# Patient Record
Sex: Female | Born: 1970 | Race: Black or African American | Hispanic: No | Smoking: Never smoker
Health system: Southern US, Community
[De-identification: ages and names within clinical notes are randomized; demographics above are authoritative.]

## PROBLEM LIST (undated history)

## (undated) DIAGNOSIS — I10 Essential (primary) hypertension: Secondary | ICD-10-CM

---

## 2014-04-10 DIAGNOSIS — E119 Type 2 diabetes mellitus without complications: Secondary | ICD-10-CM | POA: Insufficient documentation

## 2014-04-10 DIAGNOSIS — E1165 Type 2 diabetes mellitus with hyperglycemia: Secondary | ICD-10-CM | POA: Insufficient documentation

## 2015-07-25 DIAGNOSIS — N920 Excessive and frequent menstruation with regular cycle: Secondary | ICD-10-CM | POA: Insufficient documentation

## 2016-08-20 DIAGNOSIS — E113399 Type 2 diabetes mellitus with moderate nonproliferative diabetic retinopathy without macular edema, unspecified eye: Secondary | ICD-10-CM | POA: Insufficient documentation

## 2019-08-30 ENCOUNTER — Other Ambulatory Visit: Payer: Self-pay

## 2019-08-30 DIAGNOSIS — Z20822 Contact with and (suspected) exposure to covid-19: Secondary | ICD-10-CM

## 2019-09-01 LAB — NOVEL CORONAVIRUS, NAA: SARS-CoV-2, NAA: NOT DETECTED

## 2020-02-21 ENCOUNTER — Emergency Department: Payer: BLUE CROSS/BLUE SHIELD

## 2020-02-21 ENCOUNTER — Other Ambulatory Visit: Payer: Self-pay

## 2020-02-21 ENCOUNTER — Emergency Department
Admission: EM | Admit: 2020-02-21 | Discharge: 2020-02-21 | Disposition: A | Discharge status: Home / Self Care | Payer: BLUE CROSS/BLUE SHIELD | Attending: Emergency Medicine | Admitting: Emergency Medicine

## 2020-02-21 DIAGNOSIS — X58XXXA Exposure to other specified factors, initial encounter: Secondary | ICD-10-CM | POA: Insufficient documentation

## 2020-02-21 DIAGNOSIS — Y929 Unspecified place or not applicable: Secondary | ICD-10-CM | POA: Insufficient documentation

## 2020-02-21 DIAGNOSIS — L03039 Cellulitis of unspecified toe: Secondary | ICD-10-CM | POA: Diagnosis not present

## 2020-02-21 DIAGNOSIS — Y999 Unspecified external cause status: Secondary | ICD-10-CM | POA: Diagnosis not present

## 2020-02-21 DIAGNOSIS — Y93E8 Activity, other personal hygiene: Secondary | ICD-10-CM | POA: Insufficient documentation

## 2020-02-21 DIAGNOSIS — M79674 Pain in right toe(s): Secondary | ICD-10-CM | POA: Diagnosis present

## 2020-02-21 MED ORDER — SULFAMETHOXAZOLE-TRIMETHOPRIM 800-160 MG PO TABS
1.0000 | ORAL_TABLET | Freq: Once | ORAL | Status: AC
  Administered 2020-02-21: 1 via ORAL
  Filled 2020-02-21: qty 1

## 2020-02-21 MED ORDER — SULFAMETHOXAZOLE-TRIMETHOPRIM 400-80 MG PO TABS: 2.0000 | ORAL_TABLET | Freq: Two times a day (BID) | ORAL | 0 refills | Status: AC

## 2020-02-21 NOTE — ED Notes (Signed)
Reviewed discharge instructions, follow-up care, and prescriptions with patient. Patient verbalized understanding of all information reviewed. Patient stable, with no distress noted at this time.    

## 2020-02-21 NOTE — ED Provider Notes (Signed)
[facility] Emergency Department Provider Note  ____________________________________________  Time seen: Approximately 10:35 PM  I have reviewed the triage vital signs and the nursing notes.   HISTORY  Chief Complaint Toe Pain    HPI Shannon Cox is a 49 y.o. female that presents to emergency department for evaluation of infection to right great toenail.  Patient states that she had a pedicure a couple of days ago.  When she got the pedicure, she jumped and did get a minor injury to the outside of her toenail.  She clipped the side of her toenail today and noticed some drainage coming out from underneath the toenail.  She was also walking around in [place] on her feet all day for 4 days.  Patient is a diabetic and has neuropathy.  No fevers.   No past medical history on file.  There are no problems to display for this patient.   Prior to Admission medications   Medication Sig Start Date End Date Taking? Authorizing Provider  sulfamethoxazole-trimethoprim (BACTRIM) 400-80 MG tablet Take 2 tablets by mouth 2 (two) times daily for 10 days. 02/21/20 03/02/20  Laban Emperor, PA-C    Allergies Insulins  No family history on file.  Social History Social History   Tobacco Use  . Smoking status: Not on file  Substance Use Topics  . Alcohol use: Not on file  . Drug use: Not on file     Review of Systems  Constitutional: No fever/chills Gastrointestinal: No nausea, no vomiting.  Musculoskeletal: Negative for musculoskeletal pain. Skin: Negative for rash, abrasions, lacerations, ecchymosis. Neurological: Negative for new numbness or tingling   ____________________________________________   PHYSICAL EXAM:  VITAL SIGNS: ED Triage Vitals  Enc Vitals Group     BP 02/21/20 2147 (!) 158/68     Pulse Rate 02/21/20 2147 95     Resp 02/21/20 2147 20     Temp 02/21/20 2147 98.7 F (37.1 C)     Temp Source 02/21/20 2147 Oral     SpO2 02/21/20  2147 100 %     Weight 02/21/20 2148 258 lb (117 kg)     Height 02/21/20 2148 5\' 4"  (1.626 m)     Head Circumference --      Peak Flow --      Pain Score 02/21/20 2148 0     Pain Loc --      Pain Edu? --      Excl. in [place]? --      Constitutional: Alert and oriented. Well appearing and in no acute distress. Eyes: Conjunctivae are normal. PERRL. EOMI. Head: Atraumatic. ENT:      Ears:      Nose: No congestion/rhinnorhea.      Mouth/Throat: Mucous membranes are moist.  Neck: No stridor.  Cardiovascular: Normal rate, regular rhythm.  Good peripheral circulation. Respiratory: Normal respiratory effort without tachypnea or retractions. Lungs CTAB. Good air entry to the bases with no decreased or absent breath sounds. Musculoskeletal: Full range of motion to all extremities. No gross deformities appreciated.  No swelling or erythema to right great toe. No tenderness to plantar toe.  Light yellow drainage from lateral right great toenail. Neurologic:  Normal speech and language. No gross focal neurologic deficits are appreciated.  Skin:  Skin is warm, dry and intact. No rash noted. Psychiatric: Mood and affect are normal. Speech and behavior are normal. Patient exhibits appropriate insight and judgement.   ____________________________________________   LABS (all labs ordered are listed, but only  abnormal results are displayed)  Labs Reviewed - No data to display ____________________________________________  EKG   ____________________________________________  RADIOLOGY Lexine Baton, personally viewed and evaluated these images (plain radiographs) as part of my medical decision making, as well as reviewing the written report by the radiologist.  DG Foot Complete Right  Result Date: 02/21/2020 CLINICAL DATA:  Right great toe soreness, drainage EXAM: RIGHT FOOT COMPLETE - 3+ VIEW COMPARISON:  None. FINDINGS: Frontal, oblique, and lateral views of the right foot are obtained. No  fracture, subluxation, or dislocation. Joint spaces are well preserved. Soft tissues are unremarkable. IMPRESSION: 1. No acute or destructive bony lesion. Electronically Signed   By: Sharlet Salina M.D.   On: 02/21/2020 22:53    ____________________________________________    PROCEDURES  Procedure(s) performed:    Procedures    Medications  sulfamethoxazole-trimethoprim (BACTRIM DS) 800-160 MG per tablet 1 tablet (1 tablet Oral Given 02/21/20 2247)     ____________________________________________   INITIAL IMPRESSION / ASSESSMENT AND PLAN / ED COURSE  Pertinent labs & imaging results that were available during my care of the patient were reviewed by me and considered in my medical decision making (see chart for details).  Review of the [place] CSRS was performed in accordance of the [facility] prior to dispensing any controlled drugs.    Patient's diagnosis is consistent with toenail infection. No acute abnormality on xray. Patient will be discharged home with prescriptions for Bactrim. Patient is to follow up with PCP as directed. Patient is given ED precautions to return to the ED for any worsening or new symptoms.   Shannon Cox was evaluated in Emergency Department on 02/21/2020 for the symptoms described in the history of present illness. She was evaluated in the context of the global COVID-19 pandemic, which necessitated consideration that the patient might be at risk for infection with the SARS-CoV-2 virus that causes COVID-19. Institutional protocols and algorithms that pertain to the evaluation of patients at risk for COVID-19 are in a state of rapid change based on information released by regulatory bodies including the CDC and federal and state organizations. These policies and algorithms were followed during the patient's care in the ED.  ____________________________________________  FINAL CLINICAL IMPRESSION(S) / ED DIAGNOSES  Final diagnoses:  Infection of toenail       NEW MEDICATIONS STARTED DURING THIS VISIT:  ED Discharge Orders         Ordered    sulfamethoxazole-trimethoprim (BACTRIM) 400-80 MG tablet  2 times daily     02/21/20 2323              This chart was dictated using voice recognition software/Dragon. Despite best efforts to proofread, errors can occur which can change the meaning. Any change was purely unintentional.    Enid Derry, PA-C 02/21/20 2354    Dionne Bucy, MD 02/24/20 [phone]

## 2020-02-21 NOTE — ED Triage Notes (Signed)
Pt states she had pedicure done 2 weeks ago, went to [place] and did a lot of walking. She noted some right great toe soreness and today she clipped a piece of toe nail that was rubbing irritating 2nd toe and noted some drainage from nail bed.

## 2020-02-21 NOTE — ED Notes (Addendum)
See triage note- pt here with right big toe pain and nail drainage. States she had gel polish on toenials, peeled It off and clipped the toe nail. Has noticed puss is draining out of toe when she squeezes it. Pt is a diabetic, has neuropathy.

## 2020-02-21 NOTE — ED Notes (Signed)
Xray tech at bedside.

## 2020-02-24 ENCOUNTER — Telehealth: Payer: Self-pay | Admitting: Emergency Medicine

## 2020-02-24 MED ORDER — CLINDAMYCIN HCL 300 MG PO CAPS: 300.0000 mg | ORAL_CAPSULE | Freq: Three times a day (TID) | ORAL | 0 refills | Status: AC

## 2020-02-24 NOTE — Telephone Encounter (Cosign Needed)
Patient allergic to the Bactrim that I prescribed for her toenail infection. Will send in Clindamycin.

## 2020-03-21 ENCOUNTER — Ambulatory Visit: Payer: BLUE CROSS/BLUE SHIELD | Admitting: Cardiology

## 2020-07-27 ENCOUNTER — Emergency Department
Admission: EM | Admit: 2020-07-27 | Discharge: 2020-07-27 | Disposition: A | Discharge status: Home / Self Care | Payer: BLUE CROSS/BLUE SHIELD | Attending: Emergency Medicine | Admitting: Emergency Medicine

## 2020-07-27 ENCOUNTER — Other Ambulatory Visit: Payer: Self-pay

## 2020-07-27 ENCOUNTER — Emergency Department: Payer: BLUE CROSS/BLUE SHIELD

## 2020-07-27 DIAGNOSIS — R609 Edema, unspecified: Secondary | ICD-10-CM | POA: Insufficient documentation

## 2020-07-27 DIAGNOSIS — Z7984 Long term (current) use of oral hypoglycemic drugs: Secondary | ICD-10-CM | POA: Diagnosis not present

## 2020-07-27 DIAGNOSIS — R2243 Localized swelling, mass and lump, lower limb, bilateral: Secondary | ICD-10-CM | POA: Diagnosis present

## 2020-07-27 DIAGNOSIS — E113299 Type 2 diabetes mellitus with mild nonproliferative diabetic retinopathy without macular edema, unspecified eye: Secondary | ICD-10-CM | POA: Insufficient documentation

## 2020-07-27 DIAGNOSIS — R0602 Shortness of breath: Secondary | ICD-10-CM | POA: Diagnosis not present

## 2020-07-27 LAB — CBC WITH DIFFERENTIAL/PLATELET
Abs Immature Granulocytes: 0.02 10*3/uL (ref 0.00–0.07)
Basophils Absolute: 0.1 10*3/uL (ref 0.0–0.1)
Basophils Relative: 1 %
Eosinophils Absolute: 0.3 10*3/uL (ref 0.0–0.5)
Eosinophils Relative: 4 %
HCT: 40.5 % (ref 36.0–46.0)
Hemoglobin: 12.4 g/dL (ref 12.0–15.0)
Immature Granulocytes: 0 %
Lymphocytes Relative: 34 %
Lymphs Abs: 2.6 10*3/uL (ref 0.7–4.0)
MCH: 22.3 pg — ABNORMAL LOW (ref 26.0–34.0)
MCHC: 30.6 g/dL (ref 30.0–36.0)
MCV: 72.7 fL — ABNORMAL LOW (ref 80.0–100.0)
Monocytes Absolute: 0.6 10*3/uL (ref 0.1–1.0)
Monocytes Relative: 7 %
Neutro Abs: 4 10*3/uL (ref 1.7–7.7)
Neutrophils Relative %: 54 %
Platelets: 289 10*3/uL (ref 150–400)
RBC: 5.57 MIL/uL — ABNORMAL HIGH (ref 3.87–5.11)
RDW: 22 % — ABNORMAL HIGH (ref 11.5–15.5)
WBC: 7.5 10*3/uL (ref 4.0–10.5)
nRBC: 0 % (ref 0.0–0.2)

## 2020-07-27 LAB — BASIC METABOLIC PANEL
Anion gap: 9 (ref 5–15)
BUN: 15 mg/dL (ref 6–20)
CO2: 28 mmol/L (ref 22–32)
Calcium: 8.6 mg/dL — ABNORMAL LOW (ref 8.9–10.3)
Chloride: 101 mmol/L (ref 98–111)
Creatinine, Ser: 0.75 mg/dL (ref 0.44–1.00)
GFR, Estimated: >60 mL/min (ref >60)
Glucose, Bld: 257 mg/dL — ABNORMAL HIGH (ref 70–99)
Potassium: 3.7 mmol/L (ref 3.5–5.1)
Sodium: 138 mmol/L (ref 135–145)

## 2020-07-27 LAB — BRAIN NATRIURETIC PEPTIDE: B Natriuretic Peptide: 28.7 pg/mL (ref 0.0–100.0)

## 2020-07-27 LAB — TROPONIN I (HIGH SENSITIVITY): Troponin I (High Sensitivity): 8 ng/L (ref <18)

## 2020-07-27 NOTE — Discharge Instructions (Addendum)
Your lab tests, chest xray, and ekg today were all okay. Please follow up with your doctor for further evaluation of your leg swelling symptoms.

## 2020-07-27 NOTE — ED Notes (Signed)
Patient transported to X-ray 

## 2020-07-27 NOTE — ED Provider Notes (Signed)
[facility] Emergency Department Provider Note  ____________________________________________  Time seen: Approximately 11:20 PM  I have reviewed the triage vital signs and the nursing notes.   HISTORY  Chief Complaint Leg Swelling    HPI Shannon Cox is a 49 y.o. female with a history of diabetes and obesity who comes the ED complaining of peripheral edema ongoing for the past year, worse in the past week.   Associated with dyspnea on exertion.  Denies orthopnea or PND.  No chest pain.  Symptoms are better waxing and waning.        Patient Active Problem List   Diagnosis Date Noted  . Moderate nonproliferative diabetic retinopathy (HCC) 08/20/2016  . Menorrhagia 07/25/2015  . Poorly controlled type 2 diabetes mellitus (HCC) 04/10/2014        Prior to Admission medications   Not on File  Jardiance   Allergies Insulins   No family history on file.  Social History Social History   Tobacco Use  . Smoking status: Not on file  Substance Use Topics  . Alcohol use: Not on file  . Drug use: Not on file  No tobacco or alcohol use  Review of Systems  Constitutional:   No fever or chills.  ENT:   No sore throat. No rhinorrhea. Cardiovascular:   No chest pain or syncope. Respiratory:   Positive dyspnea on exertion without cough. Gastrointestinal:   Negative for abdominal pain, vomiting and diarrhea.  Musculoskeletal:   Positive bilateral leg swelling All other systems reviewed and are negative except as documented above in ROS and HPI.  ____________________________________________   PHYSICAL EXAM:  VITAL SIGNS: ED Triage Vitals  Enc Vitals Group     BP 07/27/20 2118 (!) 151/74     Pulse Rate 07/27/20 2118 95     Resp --      Temp 07/27/20 2118 98.8 F (37.1 C)     Temp src --      SpO2 07/27/20 2118 99 %     Weight 07/27/20 2114 260 lb (117.9 kg)     Height 07/27/20 2114 5\' 5"  (1.651 m)     Head Circumference --       Peak Flow --      Pain Score 07/27/20 2114 6     Pain Loc --      Pain Edu? --      Excl. in GC? --     Vital signs reviewed, nursing assessments reviewed.   Constitutional:   Alert and oriented. Non-toxic appearance. Eyes:   Conjunctivae are normal. EOMI. PERRL. ENT      Head:   Normocephalic and atraumatic.      Nose:   Wearing a mask.      Mouth/Throat:   Wearing a mask.      Neck:   No meningismus. Full ROM.  No JVD Hematological/Lymphatic/Immunilogical:   No cervical lymphadenopathy. Cardiovascular:   RRR. Symmetric bilateral radial and DP pulses.  No murmurs. Cap refill less than 2 seconds. Respiratory:   Normal respiratory effort without tachypnea/retractions. Breath sounds are clear and equal bilaterally. No wheezes/rales/rhonchi. Gastrointestinal:   Soft and nontender. Non distended. There is no CVA tenderness.  No rebound, rigidity, or guarding.  Musculoskeletal:   Normal range of motion in all extremities. No joint effusions.  No lower extremity tenderness.  Trace pitting edema bilateral lower extremities.  Symmetric calf circumference. Neurologic:   Normal speech and language.  Motor grossly intact. No acute focal neurologic deficits are  appreciated.  Skin:    Skin is warm, dry and intact. No rash noted.  No petechiae, purpura, or bullae.  ____________________________________________    LABS (pertinent positives/negatives) (all labs ordered are listed, but only abnormal results are displayed) Labs Reviewed  BASIC METABOLIC PANEL - Abnormal; Notable for the following components:      Result Value   Glucose, Bld 257 (*)    Calcium 8.6 (*)    All other components within normal limits  CBC WITH DIFFERENTIAL/PLATELET - Abnormal; Notable for the following components:   RBC 5.57 (*)    MCV 72.7 (*)    MCH 22.3 (*)    RDW 22.0 (*)    All other components within normal limits  BRAIN NATRIURETIC PEPTIDE  TROPONIN I (HIGH SENSITIVITY)    ____________________________________________   EKG  Interpreted by me Normal sinus rhythm rate of 97, normal axis and intervals.  Poor R wave progression.  Normal ST segments and T waves.  No ischemic changes  ____________________________________________    RADIOLOGY  DG Chest 2 View  Result Date: 07/27/2020 CLINICAL DATA:  Shortness of breath and leg swelling for about a week. EXAM: CHEST - 2 VIEW COMPARISON:  None. FINDINGS: Borderline heart size with normal pulmonary vascularity. No airspace disease, consolidation, or effusion. No pneumothorax. Mediastinal contours appear intact. IMPRESSION: No active cardiopulmonary disease. Electronically Signed   By: Burman Nieves M.D.   On: 07/27/2020 22:52    ____________________________________________   PROCEDURES Procedures  ____________________________________________  DIFFERENTIAL DIAGNOSIS   Non-STEMI, new onset CHF, dependent edema, renal failure, electrolyte abnormality  CLINICAL IMPRESSION / ASSESSMENT AND PLAN / ED COURSE  Medications ordered in the ED: Medications - No data to display  Pertinent labs & imaging results that were available during my care of the patient were reviewed by me and considered in my medical decision making (see chart for details).  Shannon Cox was evaluated in Emergency Department on 07/27/2020 for the symptoms described in the history of present illness. She was evaluated in the context of the global COVID-19 pandemic, which necessitated consideration that the patient might be at risk for infection with the SARS-CoV-2 virus that causes COVID-19. Institutional protocols and algorithms that pertain to the evaluation of patients at risk for COVID-19 are in a state of rapid change based on information released by regulatory bodies including the CDC and federal and state organizations. These policies and algorithms were followed during the patient's care in the ED.   Patient presents  with peripheral edema that has been chronic, but worse in the past few weeks and now associated with dyspnea on exertion.  She has no orthopnea, vitals are normal, exam is reassuring.  Labs including troponin and BNP are normal. Considering the patient's symptoms, medical history, and physical examination today, I have low suspicion for ACS, PE, TAD, pneumothorax, carditis, mediastinitis, pneumonia, CHF, or sepsis.  Doubt DVT.  We will plan to discharge to follow-up with her primary care doctor for further evaluation, consideration of TTE.      ____________________________________________   FINAL CLINICAL IMPRESSION(S) / ED DIAGNOSES    Final diagnoses:  Peripheral edema     ED Discharge Orders    None      Portions of this note were generated with dragon dictation software. Dictation errors may occur despite best attempts at proofreading.   Sharman Cheek, MD 07/27/20 2325

## 2020-07-27 NOTE — ED Triage Notes (Signed)
FIRST NURSE NOTE:  Pt arrived via POV with c/o fluid build up from hips down, states it has been going on for about a week and also reports causing her to be short of breath. Pt ambulatory without difficulty at this time.

## 2021-04-01 ENCOUNTER — Other Ambulatory Visit: Payer: Self-pay | Admitting: Internal Medicine

## 2021-04-01 ENCOUNTER — Other Ambulatory Visit: Payer: Self-pay

## 2021-04-01 ENCOUNTER — Ambulatory Visit
Admission: RE | Admit: 2021-04-01 | Discharge: 2021-04-01 | Disposition: A | Discharge status: Home / Self Care | Payer: Self-pay | Attending: Internal Medicine | Admitting: Internal Medicine

## 2021-04-01 ENCOUNTER — Ambulatory Visit
Admission: RE | Admit: 2021-04-01 | Discharge: 2021-04-01 | Disposition: A | Discharge status: Home / Self Care | Payer: Self-pay | Source: Ambulatory Visit | Attending: Internal Medicine | Admitting: Internal Medicine

## 2021-04-01 DIAGNOSIS — M549 Dorsalgia, unspecified: Secondary | ICD-10-CM

## 2021-12-28 ENCOUNTER — Emergency Department: Payer: 59

## 2021-12-28 ENCOUNTER — Other Ambulatory Visit: Payer: Self-pay

## 2021-12-28 ENCOUNTER — Emergency Department
Admission: EM | Admit: 2021-12-28 | Discharge: 2021-12-28 | Disposition: A | Discharge status: Home / Self Care | Payer: 59 | Attending: Emergency Medicine | Admitting: Emergency Medicine

## 2021-12-28 DIAGNOSIS — R051 Acute cough: Secondary | ICD-10-CM | POA: Diagnosis not present

## 2021-12-28 DIAGNOSIS — J329 Chronic sinusitis, unspecified: Secondary | ICD-10-CM | POA: Diagnosis not present

## 2021-12-28 DIAGNOSIS — R079 Chest pain, unspecified: Secondary | ICD-10-CM | POA: Diagnosis not present

## 2021-12-28 DIAGNOSIS — Z20822 Contact with and (suspected) exposure to covid-19: Secondary | ICD-10-CM | POA: Diagnosis not present

## 2021-12-28 DIAGNOSIS — R0602 Shortness of breath: Secondary | ICD-10-CM | POA: Insufficient documentation

## 2021-12-28 DIAGNOSIS — J069 Acute upper respiratory infection, unspecified: Secondary | ICD-10-CM | POA: Diagnosis not present

## 2021-12-28 LAB — BASIC METABOLIC PANEL
Anion gap: 8 (ref 5–15)
BUN: 16 mg/dL (ref 6–20)
CO2: 29 mmol/L (ref 22–32)
Calcium: 9.1 mg/dL (ref 8.9–10.3)
Chloride: 99 mmol/L (ref 98–111)
Creatinine, Ser: 0.92 mg/dL (ref 0.44–1.00)
GFR, Estimated: >60 mL/min (ref >60)
Glucose, Bld: 466 mg/dL — ABNORMAL HIGH (ref 70–99)
Potassium: 3.7 mmol/L (ref 3.5–5.1)
Sodium: 136 mmol/L (ref 135–145)

## 2021-12-28 LAB — CBC
HCT: 38.3 % (ref 36.0–46.0)
Hemoglobin: 11.8 g/dL — ABNORMAL LOW (ref 12.0–15.0)
MCH: 23.6 pg — ABNORMAL LOW (ref 26.0–34.0)
MCHC: 30.8 g/dL (ref 30.0–36.0)
MCV: 76.8 fL — ABNORMAL LOW (ref 80.0–100.0)
Platelets: 275 10*3/uL (ref 150–400)
RBC: 4.99 MIL/uL (ref 3.87–5.11)
RDW: 14.3 % (ref 11.5–15.5)
WBC: 6.9 10*3/uL (ref 4.0–10.5)
nRBC: 0 % (ref 0.0–0.2)

## 2021-12-28 LAB — RESP PANEL BY RT-PCR (FLU A&B, COVID) ARPGX2
Influenza A by PCR: NEGATIVE
Influenza B by PCR: NEGATIVE
SARS Coronavirus 2 by RT PCR: NEGATIVE

## 2021-12-28 LAB — TROPONIN I (HIGH SENSITIVITY): Troponin I (High Sensitivity): 10 ng/L (ref <18)

## 2021-12-28 MED ORDER — AZITHROMYCIN 250 MG PO TABS: ORAL_TABLET | ORAL | 0 refills | Status: AC

## 2021-12-28 NOTE — ED Provider Notes (Signed)
? ?  [facility] ?Provider Note ? ? ? Event Date/Time  ? First MD Initiated Contact with Patient 12/28/21 2114   ?  (approximate) ? ?History  ? ?Chief Complaint: Shortness of Breath ? ?HPI ? ?Shannon Cox is a 51 y.o. female presents to the emergency department for 1 week of upper respiratory symptoms.  According to the patient for the past 1 week she has been coughing with nasal and sinus congestion.  No known fever.  Denies any chest pain.  Does state mild shortness of breath at times.  Overall the patient appears well, no distress.  States she is ready to go home. ? ?Physical Exam  ? ?Triage Vital Signs: ?ED Triage Vitals  ?Enc Vitals Group  ?   BP 12/28/21 1903 (!) 145/72  ?   Pulse Rate 12/28/21 1903 88  ?   Resp 12/28/21 1903 20  ?   Temp 12/28/21 1903 98.7 ?F (37.1 ?C)  ?   Temp Source 12/28/21 1903 Oral  ?   SpO2 12/28/21 1903 94 %  ?   Weight 12/28/21 1904 243 lb (110.2 kg)  ?   Height 12/28/21 1904 5' (1.524 m)  ?   Head Circumference --   ?   Peak Flow --   ?   Pain Score 12/28/21 1904 5  ?   Pain Loc --   ?   Pain Edu? --   ?   Excl. in GC? --   ? ? ?Most recent vital signs: ?Vitals:  ? 12/28/21 1903  ?BP: (!) 145/72  ?Pulse: 88  ?Resp: 20  ?Temp: 98.7 ?F (37.1 ?C)  ?SpO2: 94%  ? ? ?General: Awake, no distress.  ?CV:  Good peripheral perfusion.  Regular rate and rhythm  ?Resp:  Normal effort.  Equal breath sounds bilaterally.  ?Abd:  No distention.  Soft, nontender.  No rebound or guarding. ?Other:  Moderate rhinorrhea/congestion ? ? ?ED Results / Procedures / Treatments  ? ?EKG ? ?EKG viewed and interpreted by myself shows a normal sinus rhythm at 87 bpm with a narrow QRS, left axis deviation, largely normal intervals, no concerning ST changes. ? ?RADIOLOGY ? ?I personally reviewed the chest x-ray images, no acute significant abnormality seen on my evaluation. ?Chest x-ray read as negative by radiology ? ? ?MEDICATIONS ORDERED IN ED: ?Medications - No data to  display ? ? ?IMPRESSION / MDM / ASSESSMENT AND PLAN / ED COURSE  ?I reviewed the triage vital signs and the nursing notes. ? ?Patient presents to the emergency department for 1 week of upper respiratory symptoms.  Patient states cough congestion sinus congestion x1 week.  Overall the patient appears well.  Reassuringly patient's labs are normal including a normal CBC.  Normal chemistry.  Negative troponin.  EKG is reassuring and chest x-ray appears clear.  Discussed with the patient checking a COVID/flu swab.  Patient is agreeable but wishes to go home.  Given patient's 1 week of symptoms we will cover with Zithromax as a precaution for possible bacterial sinusitis.  Patient agreeable and will follow-up with her doctor. ? ?FINAL CLINICAL IMPRESSION(S) / ED DIAGNOSES  ? ?Upper respiratory infection ?Sinusitis ? ?Rx / DC Orders  ? ?Zithromax ? ?Note:  This document was prepared using Dragon voice recognition software and may include unintentional dictation errors. ?  Minna Antis, MD ?12/28/21 2129 ? ?

## 2021-12-28 NOTE — ED Notes (Addendum)
Patient provided with discharge instructions, script info and follow-up info. Patient verbalized understanding. Patient ambulated out to the front lobby with a steady gait. ? ?Instructions given to check my chart for results of covid swab ?

## 2021-12-28 NOTE — ED Triage Notes (Signed)
Pt states shortness of breath since yesterday. Pt states has had uri symptoms vs. Sinus infection symptoms for a week. Pt also states has had a headache and chest pain with associated wheezing. Pt appears in no acute distress.  ?

## 2021-12-28 NOTE — ED Notes (Signed)
Registration at bedside.

## 2022-01-27 ENCOUNTER — Other Ambulatory Visit: Payer: Self-pay | Admitting: Internal Medicine

## 2022-01-27 ENCOUNTER — Ambulatory Visit
Admission: RE | Admit: 2022-01-27 | Discharge: 2022-01-27 | Disposition: A | Discharge status: Home / Self Care | Payer: 59 | Source: Ambulatory Visit | Attending: Internal Medicine | Admitting: Internal Medicine

## 2022-01-27 ENCOUNTER — Ambulatory Visit
Admission: RE | Admit: 2022-01-27 | Discharge: 2022-01-27 | Disposition: A | Discharge status: Home / Self Care | Payer: 59 | Attending: Internal Medicine | Admitting: Internal Medicine

## 2022-01-27 DIAGNOSIS — M545 Low back pain, unspecified: Secondary | ICD-10-CM | POA: Insufficient documentation

## 2022-06-02 ENCOUNTER — Emergency Department
Admission: EM | Admit: 2022-06-02 | Discharge: 2022-06-02 | Disposition: A | Discharge status: Home / Self Care | Payer: 59 | Attending: Emergency Medicine | Admitting: Emergency Medicine

## 2022-06-02 ENCOUNTER — Other Ambulatory Visit: Payer: Self-pay

## 2022-06-02 ENCOUNTER — Emergency Department: Payer: 59

## 2022-06-02 DIAGNOSIS — H8110 Benign paroxysmal vertigo, unspecified ear: Secondary | ICD-10-CM | POA: Insufficient documentation

## 2022-06-02 DIAGNOSIS — R42 Dizziness and giddiness: Secondary | ICD-10-CM | POA: Diagnosis present

## 2022-06-02 DIAGNOSIS — N3000 Acute cystitis without hematuria: Secondary | ICD-10-CM | POA: Diagnosis not present

## 2022-06-02 DIAGNOSIS — J0100 Acute maxillary sinusitis, unspecified: Secondary | ICD-10-CM | POA: Insufficient documentation

## 2022-06-02 DIAGNOSIS — E860 Dehydration: Secondary | ICD-10-CM | POA: Diagnosis not present

## 2022-06-02 HISTORY — DX: Essential (primary) hypertension: I10

## 2022-06-02 LAB — BASIC METABOLIC PANEL
Anion gap: 8 (ref 5–15)
BUN: 24 mg/dL — ABNORMAL HIGH (ref 6–20)
CO2: 24 mmol/L (ref 22–32)
Calcium: 9.2 mg/dL (ref 8.9–10.3)
Chloride: 106 mmol/L (ref 98–111)
Creatinine, Ser: 1.04 mg/dL — ABNORMAL HIGH (ref 0.44–1.00)
GFR, Estimated: >60 mL/min (ref >60)
Glucose, Bld: 253 mg/dL — ABNORMAL HIGH (ref 70–99)
Potassium: 4.1 mmol/L (ref 3.5–5.1)
Sodium: 138 mmol/L (ref 135–145)

## 2022-06-02 LAB — CBC
HCT: 42.7 % (ref 36.0–46.0)
Hemoglobin: 13.1 g/dL (ref 12.0–15.0)
MCH: 24.2 pg — ABNORMAL LOW (ref 26.0–34.0)
MCHC: 30.7 g/dL (ref 30.0–36.0)
MCV: 78.8 fL — ABNORMAL LOW (ref 80.0–100.0)
Platelets: 286 10*3/uL (ref 150–400)
RBC: 5.42 MIL/uL — ABNORMAL HIGH (ref 3.87–5.11)
RDW: 16.8 % — ABNORMAL HIGH (ref 11.5–15.5)
WBC: 6.6 10*3/uL (ref 4.0–10.5)
nRBC: 0 % (ref 0.0–0.2)

## 2022-06-02 LAB — URINALYSIS, ROUTINE W REFLEX MICROSCOPIC
Bilirubin Urine: NEGATIVE
Glucose, UA: >=500 mg/dL — AB
Ketones, ur: 5 mg/dL — AB
Nitrite: NEGATIVE
Protein, ur: >=300 mg/dL — AB
Specific Gravity, Urine: 1.021 (ref 1.005–1.030)
pH: 5 (ref 5.0–8.0)

## 2022-06-02 LAB — PREGNANCY, URINE: Preg Test, Ur: NEGATIVE

## 2022-06-02 MED ORDER — MECLIZINE HCL 25 MG PO TABS: 25.0000 mg | ORAL_TABLET | Freq: Three times a day (TID) | ORAL | 0 refills | Status: AC | PRN

## 2022-06-02 MED ORDER — AMOXICILLIN-POT CLAVULANATE 875-125 MG PO TABS: 1.0000 | ORAL_TABLET | Freq: Two times a day (BID) | ORAL | 0 refills | Status: AC

## 2022-06-02 MED ORDER — FLUTICASONE PROPIONATE 50 MCG/ACT NA SUSP: 2.0000 | Freq: Every day | NASAL | 0 refills | Status: DC

## 2022-06-02 NOTE — ED Provider Notes (Signed)
[facility] Provider Note    Event Date/Time   First MD Initiated Contact with Patient 06/02/22 1319     (approximate)   History   Dizziness   HPI  Shannon Cox is a 51 y.o. female here with multiple complaints.  The patient's primary complaint is mild dizziness, lightheadedness with standing, and transient episodes in which she feels like the room is spinning.  The symptoms seem to occur worse when she is going from lying down to sitting up.  It last several minutes and resolves.  She has had some associated nasal congestion, headache, sinus pressure.  She is also noticed her blood sugars have been increasing.  Denies any shortness of breath.  She also has noticed some increasing urinary frequency.  Denies any other complaints.  Nurse medication change.  No history of vertigo.  No dysphagia, dysarthria, focal numbness or weakness.     Physical Exam   Triage Vital Signs: ED Triage Vitals  Enc Vitals Group     BP 06/02/22 1126 (!) 162/85     Pulse Rate 06/02/22 1126 92     Resp 06/02/22 1126 17     Temp 06/02/22 1126 98.1 F (36.7 C)     Temp src --      SpO2 06/02/22 1126 100 %     Weight 06/02/22 1325 242 lb 15.2 oz (110.2 kg)     Height 06/02/22 1325 5' (1.524 m)     Head Circumference --      Peak Flow --      Pain Score 06/02/22 1125 6     Pain Loc --      Pain Edu? --      Excl. in GC? --     Most recent vital signs: Vitals:   06/02/22 1509 06/02/22 1515  BP: (!) 150/78 (!) 149/75  Pulse: 78 80  Resp: 16 16  Temp:  98.1 F (36.7 C)  SpO2: 99% 99%     General: Awake, no distress.  CV:  Good peripheral perfusion.  Resp:  Normal effort.  Abd:  No distention.  Other:  Mildly dry mucous membranes.  Cranial nerves II through XII fully intact.  No nystagmus noted.  Strength 5/5 bilateral upper and lower extremities.  Normal sensation light touch.  No dysmetria.  Normal gait.  Moderate sinus tenderness bilaterally without  facial swelling or erythema.  No photophobia or meningismus.   ED Results / Procedures / Treatments   Labs (all labs ordered are listed, but only abnormal results are displayed) Labs Reviewed  BASIC METABOLIC PANEL - Abnormal; Notable for the following components:      Result Value   Glucose, Bld 253 (*)    BUN 24 (*)    Creatinine, Ser 1.04 (*)    All other components within normal limits  CBC - Abnormal; Notable for the following components:   RBC 5.42 (*)    MCV 78.8 (*)    MCH 24.2 (*)    RDW 16.8 (*)    All other components within normal limits  URINALYSIS, ROUTINE W REFLEX MICROSCOPIC - Abnormal; Notable for the following components:   Color, Urine YELLOW (*)    APPearance CLOUDY (*)    Glucose, UA >=500 (*)    Hgb urine dipstick SMALL (*)    Ketones, ur 5 (*)    Protein, ur >=300 (*)    Leukocytes,Ua SMALL (*)    Bacteria, UA MANY (*)    All other  components within normal limits  URINE CULTURE  PREGNANCY, URINE  CBG MONITORING, ED     EKG Normal sinus rhythm, ventricular rate 89.  PR 142, QRS 84, QTc 430.  No acute ST elevations or depressions.  No EKG evidence of acute ischemia or infarct.   RADIOLOGY CT head: No acute intracranial normality   I also independently reviewed and agree with radiologist interpretations.   PROCEDURES:  Critical Care performed: No     MEDICATIONS ORDERED IN ED: Medications - No data to display   IMPRESSION / MDM / ASSESSMENT AND PLAN / ED COURSE  I reviewed the triage vital signs and the nursing notes.                               Ddx:  Differential includes the following, with pertinent life- or limb-threatening emergencies considered:  Sinusitis with peripheral vertigo, BPPV, dehydration with orthostasis, anemia, electrolyte abnormality, possible UTI with dehydration, CVA, mass/lesion  Patient's presentation is most consistent with acute presentation with potential threat to life or bodily function.  MDM:   51 yo f here with multiple complaints, primarily dizziness, generalized weakness, and some urinary sx. Re: dizziness - suspect possible sinusitis with component of peripheral vertigo. She has no dysphagia, dysarthria, or signs of central process, CT head is negative. Neuro exam is nonfocal. She has serous effusions on exam with sx suggestive of sinusitis - will tx for this likely contributing to peripheral vertigo. Otherwise, pt also appears mildly dehydrated with possible UTI. UCx sent. Augmentin for sinusitis should cover most common UTI organisms, culture pending. No signs of pyelo clinically. She does not appear septic. CBC reassuring, no leukocytosis. Renal function is normal.  Will tx for sinusitis, possible UTI, and encourage hydration with good return precautions.   MEDICATIONS GIVEN IN ED: Medications - No data to display   Consults:          FINAL CLINICAL IMPRESSION(S) / ED DIAGNOSES   Final diagnoses:  Acute cystitis without hematuria  Benign paroxysmal positional vertigo, unspecified laterality  Dehydration  Acute non-recurrent maxillary sinusitis     Rx / DC Orders   ED Discharge Orders          Ordered    meclizine (ANTIVERT) 25 MG tablet  3 times daily PRN        06/02/22 1511    amoxicillin-clavulanate (AUGMENTIN) 875-125 MG tablet  2 times daily        06/02/22 1511    fluticasone (FLONASE) 50 MCG/ACT nasal spray  Daily        06/02/22 1511             Note:  This document was prepared using Dragon voice recognition software and may include unintentional dictation errors.   Shaune Pollack, MD 06/02/22 [phone]

## 2022-06-02 NOTE — ED Triage Notes (Signed)
Pt comes with c/o dizziness for over week now. Pt states her BP was high today. Pt states meds for it. Pt states nausea and vomiting. Pt states headache.

## 2022-06-04 LAB — URINE CULTURE: Culture: >=100000 — AB

## 2022-07-24 ENCOUNTER — Encounter: Payer: Self-pay | Admitting: Emergency Medicine

## 2022-07-24 ENCOUNTER — Emergency Department
Admission: EM | Admit: 2022-07-24 | Discharge: 2022-07-25 | Disposition: A | Discharge status: Home / Self Care | Payer: BLUE CROSS/BLUE SHIELD | Attending: Emergency Medicine | Admitting: Emergency Medicine

## 2022-07-24 ENCOUNTER — Other Ambulatory Visit: Payer: Self-pay

## 2022-07-24 DIAGNOSIS — T8332XA Displacement of intrauterine contraceptive device, initial encounter: Secondary | ICD-10-CM | POA: Diagnosis not present

## 2022-07-24 DIAGNOSIS — I10 Essential (primary) hypertension: Secondary | ICD-10-CM

## 2022-07-24 DIAGNOSIS — R1010 Upper abdominal pain, unspecified: Secondary | ICD-10-CM | POA: Diagnosis present

## 2022-07-24 DIAGNOSIS — K859 Acute pancreatitis without necrosis or infection, unspecified: Secondary | ICD-10-CM | POA: Diagnosis not present

## 2022-07-24 LAB — COMPREHENSIVE METABOLIC PANEL
ALT: 10 U/L (ref 0–44)
AST: 14 U/L — ABNORMAL LOW (ref 15–41)
Albumin: 2.4 g/dL — ABNORMAL LOW (ref 3.5–5.0)
Alkaline Phosphatase: 85 U/L (ref 38–126)
Anion gap: 8 (ref 5–15)
BUN: 15 mg/dL (ref 6–20)
CO2: 28 mmol/L (ref 22–32)
Calcium: 8.8 mg/dL — ABNORMAL LOW (ref 8.9–10.3)
Chloride: 98 mmol/L (ref 98–111)
Creatinine, Ser: 0.94 mg/dL (ref 0.44–1.00)
GFR, Estimated: >60 mL/min (ref >60)
Glucose, Bld: 321 mg/dL — ABNORMAL HIGH (ref 70–99)
Potassium: 3.5 mmol/L (ref 3.5–5.1)
Sodium: 134 mmol/L — ABNORMAL LOW (ref 135–145)
Total Bilirubin: 0.6 mg/dL (ref 0.3–1.2)
Total Protein: 6 g/dL — ABNORMAL LOW (ref 6.5–8.1)

## 2022-07-24 LAB — URINALYSIS, ROUTINE W REFLEX MICROSCOPIC
Bilirubin Urine: NEGATIVE
Glucose, UA: >=500 mg/dL — AB
Ketones, ur: NEGATIVE mg/dL
Leukocytes,Ua: NEGATIVE
Nitrite: NEGATIVE
Protein, ur: >=300 mg/dL — AB
RBC / HPF: >50 RBC/hpf — ABNORMAL HIGH (ref 0–5)
Specific Gravity, Urine: 1.016 (ref 1.005–1.030)
pH: 7 (ref 5.0–8.0)

## 2022-07-24 LAB — CBC
HCT: 38.5 % (ref 36.0–46.0)
Hemoglobin: 11.9 g/dL — ABNORMAL LOW (ref 12.0–15.0)
MCH: 24.1 pg — ABNORMAL LOW (ref 26.0–34.0)
MCHC: 30.9 g/dL (ref 30.0–36.0)
MCV: 78.1 fL — ABNORMAL LOW (ref 80.0–100.0)
Platelets: 294 10*3/uL (ref 150–400)
RBC: 4.93 MIL/uL (ref 3.87–5.11)
RDW: 14.9 % (ref 11.5–15.5)
WBC: 7.3 10*3/uL (ref 4.0–10.5)
nRBC: 0 % (ref 0.0–0.2)

## 2022-07-24 LAB — LIPASE, BLOOD: Lipase: 148 U/L — ABNORMAL HIGH (ref 11–51)

## 2022-07-24 NOTE — ED Triage Notes (Signed)
Pt with abdominal pain and nausea for several days.  Pt has diabetes and states her blood sugars have been running close to 300 which is "normal for her"

## 2022-07-25 ENCOUNTER — Emergency Department: Payer: BLUE CROSS/BLUE SHIELD

## 2022-07-25 MED ORDER — OXYCODONE-ACETAMINOPHEN 5-325 MG PO TABS: 2.0000 | ORAL_TABLET | Freq: Four times a day (QID) | ORAL | 0 refills | Status: DC | PRN

## 2022-07-25 MED ORDER — IOHEXOL 300 MG/ML  SOLN
100.0000 mL | Freq: Once | INTRAMUSCULAR | Status: AC | PRN
  Administered 2022-07-25: 100 mL via INTRAVENOUS

## 2022-07-25 MED ORDER — MORPHINE SULFATE (PF) 4 MG/ML IV SOLN
4.0000 mg | Freq: Once | INTRAVENOUS | Status: AC
  Administered 2022-07-25: 4 mg via INTRAVENOUS
  Filled 2022-07-25: qty 1

## 2022-07-25 MED ORDER — LACTATED RINGERS IV BOLUS
1000.0000 mL | Freq: Once | INTRAVENOUS | Status: AC
  Administered 2022-07-25: 1000 mL via INTRAVENOUS

## 2022-07-25 MED ORDER — AMLODIPINE BESYLATE 5 MG PO TABS: 5.0000 mg | ORAL_TABLET | Freq: Every day | ORAL | 0 refills | Status: DC

## 2022-07-25 MED ORDER — ONDANSETRON 4 MG PO TBDP: ORAL_TABLET | ORAL | 0 refills | Status: DC

## 2022-07-25 MED ORDER — ONDANSETRON HCL 4 MG/2ML IJ SOLN
4.0000 mg | INTRAMUSCULAR | Status: AC
  Administered 2022-07-25: 4 mg via INTRAVENOUS
  Filled 2022-07-25: qty 2

## 2022-07-25 NOTE — ED Provider Notes (Signed)
[facility] Provider Note    Event Date/Time   First MD Initiated Contact with Patient 07/24/22 2357     (approximate)   History   Abdominal Pain   HPI  Shannon Cox is a 51 y.o. female who presents for evaluation of upper abdominal pain.  This has been present for at least a week.  She says she just got tired of dealing with the pain.  She has had some nausea but no vomiting.  No diarrhea.  She denies any alcohol use, only rarely and not recently.  No recent medication changes.  The pain is fairly constant.  She has not noticed that it is any worse if she eats or drinks anything although she has not had much to eat or drink recently.     Physical Exam   Triage Vital Signs: ED Triage Vitals  Enc Vitals Group     BP 07/24/22 2242 (!) 225/100     Pulse Rate 07/24/22 2242 94     Resp 07/24/22 2242 18     Temp 07/24/22 2242 98.8 F (37.1 C)     Temp Source 07/24/22 2242 Oral     SpO2 07/24/22 2242 96 %     Weight --      Height --      Head Circumference --      Peak Flow --      Pain Score 07/24/22 2240 6     Pain Loc --      Pain Edu? --      Excl. in [place]? --     Most recent vital signs: Vitals:   07/25/22 0053 07/25/22 0307  BP: (!) 190/78 (!) 187/81  Pulse: 84 79  Resp: 18 18  Temp:    SpO2: 98% 99%     General: Awake, no distress.  CV:  Good peripheral perfusion.  Resp:  Normal effort.  Abd:  No distention.  Tender to palpation of the epigastrium, no tenderness in the right upper quadrant with negative Murphy sign.  No lower abdominal tenderness and no rebound or guarding.   ED Results / Procedures / Treatments   Labs (all labs ordered are listed, but only abnormal results are displayed) Labs Reviewed  LIPASE, BLOOD - Abnormal; Notable for the following components:      Result Value   Lipase 148 (*)    All other components within normal limits  COMPREHENSIVE METABOLIC PANEL - Abnormal; Notable for the following  components:   Sodium 134 (*)    Glucose, Bld 321 (*)    Calcium 8.8 (*)    Total Protein 6.0 (*)    Albumin 2.4 (*)    AST 14 (*)    All other components within normal limits  CBC - Abnormal; Notable for the following components:   Hemoglobin 11.9 (*)    MCV 78.1 (*)    MCH 24.1 (*)    All other components within normal limits  URINALYSIS, ROUTINE W REFLEX MICROSCOPIC - Abnormal; Notable for the following components:   Color, Urine YELLOW (*)    APPearance HAZY (*)    Glucose, UA >=500 (*)    Hgb urine dipstick LARGE (*)    Protein, ur >=300 (*)    RBC / HPF >50 (*)    Bacteria, UA MANY (*)    All other components within normal limits  URINE CULTURE  POC URINE PREG, ED     RADIOLOGY As documented below, CT scan of  the abdomen pelvis is generally reassuring.  However there was some concern about malpositioning of the IUD.    PROCEDURES:  Critical Care performed: No  Procedures   MEDICATIONS ORDERED IN ED: Medications  lactated ringers bolus 1,000 mL (0 mLs Intravenous Stopped 07/25/22 0256)  morphine (PF) 4 MG/ML injection 4 mg (4 mg Intravenous Given 07/25/22 0049)  ondansetron (ZOFRAN) injection 4 mg (4 mg Intravenous Given 07/25/22 0048)  iohexol (OMNIPAQUE) 300 MG/ML solution 100 mL (100 mLs Intravenous Contrast Given 07/25/22 0100)     IMPRESSION / MDM / ASSESSMENT AND PLAN / ED COURSE  I reviewed the triage vital signs and the nursing notes.                              Differential diagnosis includes, but is not limited to, pancreatitis, biliary colic/gallbladder disease, medication or drug side effect, electrolyte or metabolic abnormality including possible renal dysfunction, SBO/ileus.  Patient's presentation is most consistent with acute presentation with potential threat to life or bodily function.  Labs/studies ordered: CMP, lipase, CBC, urinalysis, urine culture.  Patient's labs are notable for essentially normal LFTs and comprehensive metabolic  panel in general and a normal CBC, but she has a lipase of 148 which may indicate acute pancreatitis.  She has some hematuria but no clear evidence of infection and she has no urinary symptoms.  Given her lipase elevation and her epigastric tenderness to palpation, we discussed today and decided to proceed with a CT scan of the abdomen and pelvis.  Her CT scan was generally reassuring and did not show radiographic evidence of pancreatitis, which does not rule it out but it suggest a relatively mild case.  However, I viewed and interpreted the CT scan myself and could see that she has an IUD in place.  The radiologist, however, that the IUD seems to be malpositioned and recommended, oncological consultation.  I explained this to the patient and she said that she has a GYN with [facility] with whom she can follow-up shortly.  She confirmed that she has had no pelvic or vaginal symptoms.  I will discuss the case with the on-call OB/GYN or certified nurse midwife, but anticipate she may be appropriate for outpatient follow-up.  In the meantime I had ordered morphine 4 mg IV and Zofran 4 mg IV as well as LR 1 L IV bolus.  She is currently feeling much better, is using her phone without difficulty, and has tolerated oral intake.  I considered hospitalization and I discussed it with her, but she would rather not stay in the hospital if necessary, and prefers to try outpatient management which I think is very reasonable and appropriate.    Clinical Course as of 07/25/22 [id number]  Sat Jul 25, 2022  0154 Consulted OB/GYN Lloyd Huger, CNM) by phone.  Left a message to call me back. [CF]  0208 I checked the [place] controlled substance database and I see no evidence of concerning prescribing patterns.  I am writing a prescription for Percocet as well as Zofran.  Additionally, as previously described, I will treat her uncontrolled hypertension with a prescription for amlodipine, and she understands she needs to  follow-up with her primary care doctor to discuss additional management. [CF]  0214 Consulted and discussed case by phone with Lloyd Huger with OB/GYN.  Explained the situation and the asymptomatic possible malposition of the IUD.  She agreed that this is not emergent and the  patient can follow-up with her OB/GYN provider as an outpatient.  I discussed everything again with the patient including her uncontrolled hypertension.  She said that she takes HCTZ but she will consider taking the amlodipine and definitely will follow-up with her PCP.  I gave my usual and customary return precautions for all her diagnoses and issues and she is very comfortable with the plan to go home and is currently pain-free. [CF]    Clinical Course User Index [CF] Hinda Kehr, MD     FINAL CLINICAL IMPRESSION(S) / ED DIAGNOSES   Final diagnoses:  Acute pancreatitis without infection or necrosis, unspecified pancreatitis type  Malpositioned IUD, initial encounter  Uncontrolled hypertension     Rx / DC Orders   ED Discharge Orders          Ordered    amLODipine (NORVASC) 5 MG tablet  Daily        07/25/22 0211    oxyCODONE-acetaminophen (PERCOCET) 5-325 MG tablet  Every 6 hours PRN        07/25/22 0211    ondansetron (ZOFRAN-ODT) 4 MG disintegrating tablet        07/25/22 0211             Note:  This document was prepared using Dragon voice recognition software and may include unintentional dictation errors.   Hinda Kehr, MD 07/25/22 [phone]

## 2022-07-25 NOTE — ED Notes (Signed)
Patient transported to CT 

## 2022-07-25 NOTE — ED Notes (Signed)
Patient discharged at this time. Ambulated to lobby with independent and steady gait. Breathing unlabored speaking in full sentences. Verbalized understanding of all discharge, follow up, and medication teaching. Discharged homed with all belongings.   

## 2022-07-25 NOTE — Discharge Instructions (Addendum)
As we discussed, your workup and physical exam today suggest that your symptoms are caused by a condition called pancreatitis.  Your symptoms are relatively mild, and we all agree that you will likely improve at home.  However, you need to stick to the eating plan recommendations included in these documents.  Please also read through the information about pancreatitis in general.  Avoid drinking alcohol.    Take Percocet as prescribed for severe pain. Do not drink alcohol, drive or participate in any other potentially dangerous activities while taking this medication as it may make you sleepy. Do not take this medication with any other sedating medications, either prescription or over-the-counter. If you were prescribed Percocet or Vicodin, do not take these with acetaminophen (Tylenol) as it is already contained within these medications.   This medication is an opiate (or narcotic) pain medication and can be habit forming.  Use it as little as possible to achieve adequate pain control.  Do not use or use it with extreme caution if you have a history of opiate abuse or dependence.  If you are on a pain contract with your primary care doctor or a pain specialist, be sure to let them know you were prescribed this medication today from the [facility] Emergency Department.  This medication is intended for your use only - do not give any to anyone else and keep it in a secure place where nobody else, especially children, have access to it.  It will also cause or worsen constipation, so you may want to consider taking an over-the-counter stool softener while you are taking this medication.  Return to the emergency department if you develop new or worsening symptoms that concern you, including but not limited to worsening abdominal pain, persistent vomiting, fever, or inability to eat or drink anything as a result of the other symptoms.  We also discussed that your blood pressure is very high and you would  benefit from management of the blood pressure with medication.  We wrote you a prescription for amlodipine which is a good antihypertensive medication to start with.  You can fill the prescription and start taking it or you can follow-up with your primary care doctor to discuss the plan.  Of final note, ED your CT scan showed that your IUD may be out of position and may need to be adjusted or removed.  This is best done through your GYN provider, particularly if your primary care doctor is not the one to have originally inserted it.  Please call your GYN provider at the next available opportunity and schedule the next available appointment.

## 2022-07-27 LAB — URINE CULTURE: Culture: >=100000 — AB

## 2022-07-28 NOTE — Progress Notes (Signed)
ED Antimicrobial Stewardship Positive Culture Follow Up   Shannon Cox is an 51 y.o. female who presented to [facility] on 07/24/2022 with a chief complaint of  upper abdominal pain and nausea.   Chief Complaint  Patient presents with   Abdominal Pain    Recent Results (from the past 720 hour(s))  Urine Culture     Status: Abnormal   Collection Time: 07/24/22 10:44 PM   Specimen: Urine, Clean Catch  Result Value Ref Range Status   Specimen Description   Final    URINE, CLEAN CATCH Performed at [facility], [address] [place], [place] [address]    Special Requests   Final    NONE Performed at [facility], [place], [place] [address]    Culture >=100,000 COLONIES/mL ESCHERICHIA COLI (A)  Final   Report Status 07/27/2022 FINAL  Final   Organism ID, Bacteria ESCHERICHIA COLI (A)  Final      Susceptibility   Escherichia coli - MIC*    AMPICILLIN >=32 RESISTANT Resistant     CEFAZOLIN <=4 SENSITIVE Sensitive     CEFEPIME <=0.12 SENSITIVE Sensitive     CEFTRIAXONE <=0.25 SENSITIVE Sensitive     CIPROFLOXACIN <=0.25 SENSITIVE Sensitive     GENTAMICIN <=1 SENSITIVE Sensitive     IMIPENEM <=0.25 SENSITIVE Sensitive     NITROFURANTOIN <=16 SENSITIVE Sensitive     TRIMETH/SULFA <=20 SENSITIVE Sensitive     AMPICILLIN/SULBACTAM 16 INTERMEDIATE Intermediate     PIP/TAZO <=4 SENSITIVE Sensitive     * >=100,000 COLONIES/mL ESCHERICHIA COLI     [x]  Patient discharged originally without antimicrobial agent and treatment is now indicated  New antibiotic prescription: none at this time pending discussion with patient to determine if symptomatic and need to treat or not.  I Called patient and left HIPAA compliant voicemail to call me back.    ED Provider: Dr. Lottie Mussel, PharmD, BCPS, [place] Work Cell: [phone] 07/28/2022 10:58 AM   Clinical Pharmacist Monday - Friday phone -  [phone] Saturday - Sunday phone -  [phone]

## 2022-08-10 ENCOUNTER — Other Ambulatory Visit: Payer: Self-pay | Admitting: Internal Medicine

## 2022-08-10 DIAGNOSIS — K859 Acute pancreatitis without necrosis or infection, unspecified: Secondary | ICD-10-CM

## 2022-08-10 DIAGNOSIS — R748 Abnormal levels of other serum enzymes: Secondary | ICD-10-CM

## 2022-08-11 ENCOUNTER — Ambulatory Visit
Admission: RE | Admit: 2022-08-11 | Discharge: 2022-08-11 | Disposition: A | Discharge status: Home / Self Care | Payer: BLUE CROSS/BLUE SHIELD | Source: Ambulatory Visit | Attending: Internal Medicine | Admitting: Internal Medicine

## 2022-08-11 DIAGNOSIS — R748 Abnormal levels of other serum enzymes: Secondary | ICD-10-CM | POA: Diagnosis present

## 2022-08-25 ENCOUNTER — Emergency Department: Payer: BLUE CROSS/BLUE SHIELD

## 2022-08-25 ENCOUNTER — Encounter: Payer: Self-pay | Admitting: Emergency Medicine

## 2022-08-25 ENCOUNTER — Emergency Department
Admission: EM | Admit: 2022-08-25 | Discharge: 2022-08-25 | Disposition: A | Discharge status: Home / Self Care | Payer: BLUE CROSS/BLUE SHIELD | Attending: Emergency Medicine | Admitting: Emergency Medicine

## 2022-08-25 DIAGNOSIS — K859 Acute pancreatitis without necrosis or infection, unspecified: Secondary | ICD-10-CM | POA: Diagnosis not present

## 2022-08-25 DIAGNOSIS — E876 Hypokalemia: Secondary | ICD-10-CM | POA: Diagnosis not present

## 2022-08-25 DIAGNOSIS — N390 Urinary tract infection, site not specified: Secondary | ICD-10-CM | POA: Diagnosis not present

## 2022-08-25 DIAGNOSIS — Z79899 Other long term (current) drug therapy: Secondary | ICD-10-CM | POA: Diagnosis not present

## 2022-08-25 DIAGNOSIS — E119 Type 2 diabetes mellitus without complications: Secondary | ICD-10-CM | POA: Diagnosis not present

## 2022-08-25 DIAGNOSIS — R1013 Epigastric pain: Secondary | ICD-10-CM

## 2022-08-25 DIAGNOSIS — I10 Essential (primary) hypertension: Secondary | ICD-10-CM | POA: Diagnosis present

## 2022-08-25 LAB — HEPATIC FUNCTION PANEL
ALT: 12 U/L (ref 0–44)
AST: 19 U/L (ref 15–41)
Albumin: 2.4 g/dL — ABNORMAL LOW (ref 3.5–5.0)
Alkaline Phosphatase: 80 U/L (ref 38–126)
Bilirubin, Direct: <0.1 mg/dL (ref 0.0–0.2)
Total Bilirubin: 0.6 mg/dL (ref 0.3–1.2)
Total Protein: 6.2 g/dL — ABNORMAL LOW (ref 6.5–8.1)

## 2022-08-25 LAB — CBC
HCT: 33.4 % — ABNORMAL LOW (ref 36.0–46.0)
Hemoglobin: 10.3 g/dL — ABNORMAL LOW (ref 12.0–15.0)
MCH: 23.6 pg — ABNORMAL LOW (ref 26.0–34.0)
MCHC: 30.8 g/dL (ref 30.0–36.0)
MCV: 76.6 fL — ABNORMAL LOW (ref 80.0–100.0)
Platelets: 337 10*3/uL (ref 150–400)
RBC: 4.36 MIL/uL (ref 3.87–5.11)
RDW: 14.5 % (ref 11.5–15.5)
WBC: 8.8 10*3/uL (ref 4.0–10.5)
nRBC: 0 % (ref 0.0–0.2)

## 2022-08-25 LAB — URINALYSIS, ROUTINE W REFLEX MICROSCOPIC
Bilirubin Urine: NEGATIVE
Glucose, UA: >=500 mg/dL — AB
Ketones, ur: NEGATIVE mg/dL
Nitrite: NEGATIVE
Protein, ur: >=300 mg/dL — AB
Specific Gravity, Urine: 1.01 (ref 1.005–1.030)
pH: 6 (ref 5.0–8.0)

## 2022-08-25 LAB — BASIC METABOLIC PANEL
Anion gap: 7 (ref 5–15)
BUN: 20 mg/dL (ref 6–20)
CO2: 25 mmol/L (ref 22–32)
Calcium: 8.8 mg/dL — ABNORMAL LOW (ref 8.9–10.3)
Chloride: 108 mmol/L (ref 98–111)
Creatinine, Ser: 1.12 mg/dL — ABNORMAL HIGH (ref 0.44–1.00)
GFR, Estimated: 60 mL/min — ABNORMAL LOW (ref >60)
Glucose, Bld: 239 mg/dL — ABNORMAL HIGH (ref 70–99)
Potassium: 3.2 mmol/L — ABNORMAL LOW (ref 3.5–5.1)
Sodium: 140 mmol/L (ref 135–145)

## 2022-08-25 LAB — LIPASE, BLOOD: Lipase: 103 U/L — ABNORMAL HIGH (ref 11–51)

## 2022-08-25 LAB — TROPONIN I (HIGH SENSITIVITY)
Troponin I (High Sensitivity): 23 ng/L — ABNORMAL HIGH (ref <18)
Troponin I (High Sensitivity): 22 ng/L — ABNORMAL HIGH (ref <18)

## 2022-08-25 MED ORDER — AMLODIPINE BESYLATE 10 MG PO TABS: 10.0000 mg | ORAL_TABLET | Freq: Every day | ORAL | 0 refills | Status: DC

## 2022-08-25 MED ORDER — ONDANSETRON HCL 4 MG/2ML IJ SOLN
4.0000 mg | Freq: Once | INTRAMUSCULAR | Status: AC
  Administered 2022-08-25: 4 mg via INTRAVENOUS
  Filled 2022-08-25: qty 2

## 2022-08-25 MED ORDER — HYDRALAZINE HCL 20 MG/ML IJ SOLN
10.0000 mg | Freq: Once | INTRAMUSCULAR | Status: AC
  Administered 2022-08-25: 10 mg via INTRAVENOUS
  Filled 2022-08-25: qty 1

## 2022-08-25 MED ORDER — POTASSIUM CHLORIDE CRYS ER 20 MEQ PO TBCR
40.0000 meq | EXTENDED_RELEASE_TABLET | Freq: Once | ORAL | Status: AC
  Administered 2022-08-25: 40 meq via ORAL
  Filled 2022-08-25: qty 2

## 2022-08-25 MED ORDER — HYDROCODONE-ACETAMINOPHEN 5-325 MG PO TABS: 1.0000 | ORAL_TABLET | Freq: Four times a day (QID) | ORAL | 0 refills | Status: DC | PRN

## 2022-08-25 NOTE — Discharge Instructions (Signed)
1.  Make these changes to your blood pressure medicines: Cut Lisinopril in half to 20mg  daily Increase Amlodipine to 10mg  daily 2.  Take Lisinopril and your other blood pressure pill in the morning. 3.  Take Amlodipine in the afternoon. 4.  If you are able, take your blood pressure several times daily and record these in a log to take to your doctor. 5.  Take Norco as needed for abdominal pain. 6.  Return to the ER for worsening symptoms, persistent vomiting, difficulty breathing or other concerns.

## 2022-08-25 NOTE — ED Triage Notes (Signed)
Pt presents via POV with complaints of HTN, headache, nausea, SOB, and a dry cough for the last two weeks. Pt states her Lisinopril was adjusted around the same time her sx started. She notes her BP at home being 225/110 and she has been complaint with medications. Denies CP or SOB.

## 2022-08-25 NOTE — ED Notes (Signed)
Patient transported to X-ray 

## 2022-08-25 NOTE — ED Provider Notes (Signed)
[facility] Provider Note    Event Date/Time   First MD Initiated Contact with Patient 08/25/22 0103     (approximate)   History   Hypertension   HPI  Shannon Cox is a 51 y.o. female who presents to the ED from home with a chief complaint of elevated blood pressure.  Patient states her blood pressure has been elevated for quite some time.  States her lisinopril was increased from 20 mg to 40 mg 2 weeks ago.  With that increase she has noted a persistent dry cough.  She was seen in the ED a few weeks ago for pancreatitis and started on amlodipine 5 mg daily at that time.  States she also takes HCTZ combination medication but does not know the name or dosage.  Presents tonight with frontal headache, nausea, and shortness of breath.  Denies fever, chest pain, abdominal pain, nausea, vomiting or dizziness.  Currently taking Cipro for presumed UTI.     Past Medical History   Past Medical History:  Diagnosis Date   Hypertension      Active Problem List   Patient Active Problem List   Diagnosis Date Noted   Moderate nonproliferative diabetic retinopathy (HCC) 08/20/2016   Menorrhagia 07/25/2015   Poorly controlled type 2 diabetes mellitus (HCC) 04/10/2014     Past Surgical History  History reviewed. No pertinent surgical history.   Home Medications   Prior to Admission medications   Medication Sig Start Date End Date Taking? Authorizing Provider  amLODipine (NORVASC) 10 MG tablet Take 1 tablet (10 mg total) by mouth daily. 08/25/22 08/25/23 Yes Irean Hong, MD  HYDROcodone-acetaminophen (NORCO) 5-325 MG tablet Take 1 tablet by mouth every 6 (six) hours as needed for moderate pain. 08/25/22  Yes Irean Hong, MD  amLODipine (NORVASC) 5 MG tablet Take 1 tablet (5 mg total) by mouth daily. 07/25/22 10/23/22  Loleta Rose, MD  fluticasone (FLONASE) 50 MCG/ACT nasal spray Place 2 sprays into both nostrils daily. 06/02/22 07/02/22  Shaune Pollack, MD  meclizine (ANTIVERT) 25 MG tablet Take 1 tablet (25 mg total) by mouth 3 (three) times daily as needed for dizziness. 06/02/22   Shaune Pollack, MD  ondansetron (ZOFRAN-ODT) 4 MG disintegrating tablet Allow 1-2 tablets to dissolve in your mouth every 8 hours as needed for nausea/vomiting 07/25/22   Loleta Rose, MD  oxyCODONE-acetaminophen (PERCOCET) 5-325 MG tablet Take 2 tablets by mouth every 6 (six) hours as needed for severe pain. 07/25/22   Loleta Rose, MD     Allergies  Insulins and Sulfa antibiotics   Family History  History reviewed. No pertinent family history.   Physical Exam  Triage Vital Signs: ED Triage Vitals  Enc Vitals Group     BP 08/25/22 0049 (!) 218/102     Pulse Rate 08/25/22 0049 (!) 105     Resp 08/25/22 0049 20     Temp 08/25/22 0049 97.8 F (36.6 C)     Temp Source 08/25/22 0049 Oral     SpO2 08/25/22 0049 99 %     Weight 08/25/22 0048 254 lb (115.2 kg)     Height 08/25/22 0048 5\' 5"  (1.651 m)     Head Circumference --      Peak Flow --      Pain Score 08/25/22 0048 6     Pain Loc --      Pain Edu? --      Excl. in [place]? --  Updated Vital Signs: BP (!) 187/72   Pulse 100   Temp 98.6 F (37 C) (Oral)   Resp (!) 24   Ht 5\' 5"  (1.651 m)   Wt 115.2 kg   LMP 08/20/2022 (Exact Date)   SpO2 91%   BMI 42.27 kg/m    General: Awake, mild distress.  CV:  RRR.  Good peripheral perfusion.  Resp:  Normal effort.  CTAB. Abd:  Mild tenderness to epigastrium without rebound or guarding.  No distention.  Other:  PERRL.  EOMI.  No carotid bruits.  Supple neck without meningismus.  Alert and oriented x3.  CN II-XII grossly intact.  5/5 motor strength and sensation all extremities. MAEx4.    ED Results / Procedures / Treatments  Labs (all labs ordered are listed, but only abnormal results are displayed) Labs Reviewed  BASIC METABOLIC PANEL - Abnormal; Notable for the following components:      Result Value   Potassium 3.2 (*)     Glucose, Bld 239 (*)    Creatinine, Ser 1.12 (*)    Calcium 8.8 (*)    GFR, Estimated 60 (*)    All other components within normal limits  CBC - Abnormal; Notable for the following components:   Hemoglobin 10.3 (*)    HCT 33.4 (*)    MCV 76.6 (*)    MCH 23.6 (*)    All other components within normal limits  URINALYSIS, ROUTINE W REFLEX MICROSCOPIC - Abnormal; Notable for the following components:   Color, Urine YELLOW (*)    APPearance HAZY (*)    Glucose, UA >=500 (*)    Hgb urine dipstick SMALL (*)    Protein, ur >=300 (*)    Leukocytes,Ua SMALL (*)    Bacteria, UA RARE (*)    All other components within normal limits  HEPATIC FUNCTION PANEL - Abnormal; Notable for the following components:   Total Protein 6.2 (*)    Albumin 2.4 (*)    All other components within normal limits  LIPASE, BLOOD - Abnormal; Notable for the following components:   Lipase 103 (*)    All other components within normal limits  TROPONIN I (HIGH SENSITIVITY) - Abnormal; Notable for the following components:   Troponin I (High Sensitivity) 23 (*)    All other components within normal limits  TROPONIN I (HIGH SENSITIVITY) - Abnormal; Notable for the following components:   Troponin I (High Sensitivity) 22 (*)    All other components within normal limits  URINE CULTURE     EKG  ED ECG REPORT I, Dorotha Hirschi J, the attending physician, personally viewed and interpreted this ECG.   Date: 08/25/2022  EKG Time: 0052  Rate: 98  Rhythm: normal sinus rhythm  Axis: Normal  Intervals:none  ST&T Change: Nonspecific    RADIOLOGY I have independently visualized and interpreted patient's CT and chest x-ray as well as noted the radiology interpretation:  CT head: No ICH  Chest x-ray: No active cardiopulmonary process  Official radiology report(s): CT Head Wo Contrast  Result Date: 08/25/2022 CLINICAL DATA:  Headache EXAM: CT HEAD WITHOUT CONTRAST TECHNIQUE: Contiguous axial images were obtained  from the base of the skull through the vertex without intravenous contrast. RADIATION DOSE REDUCTION: This exam was performed according to the departmental dose-optimization program which includes automated exposure control, adjustment of the mA and/or kV according to patient size and/or use of iterative reconstruction technique. COMPARISON:  Head CT 06/02/2022 FINDINGS: Brain: No evidence of acute infarction, hemorrhage, hydrocephalus, extra-axial collection  or mass lesion/mass effect. Vascular: No hyperdense vessel or unexpected calcification. Skull: Normal. Negative for fracture or focal lesion. Sinuses/Orbits: No acute finding. Other: None. IMPRESSION: No acute intracranial abnormality. Electronically Signed   By: Darliss Cheney M.D.   On: 08/25/2022 01:56   DG Chest 2 View  Result Date: 08/25/2022 CLINICAL DATA:  Shortness of breath EXAM: CHEST - 2 VIEW COMPARISON:  12/28/2021 FINDINGS: Linear atelectasis at the left base. Right lung clear. Heart is normal size. No effusions. No acute bony abnormality. IMPRESSION: Minimal left base atelectasis.  No active disease. Electronically Signed   By: Charlett Nose M.D.   On: 08/25/2022 01:39     PROCEDURES:  Critical Care performed: No  .1-3 Lead EKG Interpretation  Performed by: Irean Hong, MD Authorized by: Irean Hong, MD     Interpretation: normal     ECG rate:  98   ECG rate assessment: normal     Rhythm: sinus rhythm     Ectopy: none     Conduction: normal   Comments:     Patient placed on cardiac monitor to evaluate for arrhythmias    MEDICATIONS ORDERED IN ED: Medications  ondansetron (ZOFRAN) injection 4 mg (4 mg Intravenous Given 08/25/22 0124)  hydrALAZINE (APRESOLINE) injection 10 mg (10 mg Intravenous Given 08/25/22 0126)  potassium chloride SA (KLOR-CON M) CR tablet 40 mEq (40 mEq Oral Given 08/25/22 0407)     IMPRESSION / MDM / ASSESSMENT AND PLAN / ED COURSE  I reviewed the triage vital signs and the nursing notes.                              51 year old female presenting with elevated blood pressure.  Differential diagnosis includes but is not limited to ICH, CVA, ACS, metabolic, infectious etiologies, etc.  I personally reviewed patient's records and note a orthopedic office visit on 08/05/2022 for low back pain.  I have reviewed patient's medication list in the chart and only see an entry for amlodipine 5 mg daily.  Patient's presentation is most consistent with acute presentation with potential threat to life or bodily function.  The patient is on the cardiac monitor to evaluate for evidence of arrhythmia and/or significant heart rate changes.  Obtain cardiac panel, CT head, chest x-ray.  Administer IV Zofran for nausea, 10 mg IV Hydralazine for blood pressure control.  Will reassess.  Clinical Course as of 08/25/22 0412  Tue Aug 25, 2022  0239 BP 164/75 CT head and cxr unremarkable. Initial troponin mildly elevated. Lipase 103 which has improved from prior. Awaiting repeat troponin and urine sample. [JS]  0409 Awakened patient from sleep to update her of all test results.  2 sets of troponin remained stable, mild hypokalemia with potassium 3.2, small leukocyte positive UA.  Patient currently on day 5/7 of Cipro for UTI; will add culture.  BP improved after hydralazine, now hovering between 180s to 190s systolic.  Diastolic much improved.  Patient denies headache, blurry vision, chest pain or shortness of breath.  Will adjust her antihypertensives: Reduce lisinopril back down to 20 mg given she is having side effects of incessant cough, increase amlodipine to 10 mg.  She will take lisinopril plus her diuretic in the morning, then stagger amlodipine in the afternoon.  Will prescribe Norco for ongoing abdominal pain secondary to pancreatitis.  Patient not taking Percocet as she feels it is too strong for her.  No imaging needed as  patient had CT and ultrasound recently.  Strict return precautions given.  Patient  verbalizes understanding and agrees with plan of care. [JS]    Clinical Course User Index [JS] Irean Hong, MD     FINAL CLINICAL IMPRESSION(S) / ED DIAGNOSES   Final diagnoses:  Hypertension, unspecified type  Hypokalemia  Acute pancreatitis without infection or necrosis, unspecified pancreatitis type  Epigastric pain  Urinary tract infection without hematuria, site unspecified     Rx / DC Orders   ED Discharge Orders          Ordered    amLODipine (NORVASC) 10 MG tablet  Daily        08/25/22 0406    HYDROcodone-acetaminophen (NORCO) 5-325 MG tablet  Every 6 hours PRN        08/25/22 0407             Note:  This document was prepared using Dragon voice recognition software and may include unintentional dictation errors.   Irean Hong, MD 08/25/22 [phone]

## 2022-08-26 LAB — URINE CULTURE: Culture: 40000 — AB

## 2022-09-16 ENCOUNTER — Encounter: Payer: Self-pay | Admitting: Urology

## 2022-09-16 ENCOUNTER — Encounter (INDEPENDENT_AMBULATORY_CARE_PROVIDER_SITE_OTHER): Payer: BLUE CROSS/BLUE SHIELD | Admitting: Urology

## 2022-09-16 LAB — MICROSCOPIC EXAMINATION: Epithelial Cells (non renal): >10 /hpf — AB (ref 0–10)

## 2022-09-16 LAB — URINALYSIS, COMPLETE
Bilirubin, UA: NEGATIVE
Ketones, UA: NEGATIVE
Nitrite, UA: NEGATIVE
Specific Gravity, UA: 1.02 (ref 1.005–1.030)
Urobilinogen, Ur: 0.2 mg/dL (ref 0.2–1.0)
pH, UA: 6.5 (ref 5.0–7.5)

## 2022-09-16 NOTE — Progress Notes (Signed)
Referred for recurrent UTI however her insurance was not accepted by [facility].  She elected not to be seen

## 2022-10-06 ENCOUNTER — Other Ambulatory Visit: Payer: Self-pay

## 2022-10-06 ENCOUNTER — Encounter: Payer: Self-pay | Admitting: Emergency Medicine

## 2022-10-06 ENCOUNTER — Emergency Department
Admission: EM | Admit: 2022-10-06 | Discharge: 2022-10-06 | Disposition: A | Discharge status: Home / Self Care | Payer: BLUE CROSS/BLUE SHIELD | Attending: Emergency Medicine | Admitting: Emergency Medicine

## 2022-10-06 DIAGNOSIS — R1013 Epigastric pain: Secondary | ICD-10-CM | POA: Diagnosis present

## 2022-10-06 DIAGNOSIS — K2970 Gastritis, unspecified, without bleeding: Secondary | ICD-10-CM | POA: Diagnosis not present

## 2022-10-06 DIAGNOSIS — R1084 Generalized abdominal pain: Secondary | ICD-10-CM

## 2022-10-06 LAB — COMPREHENSIVE METABOLIC PANEL
ALT: 11 U/L (ref 0–44)
AST: 15 U/L (ref 15–41)
Albumin: 2.3 g/dL — ABNORMAL LOW (ref 3.5–5.0)
Alkaline Phosphatase: 94 U/L (ref 38–126)
Anion gap: 7 (ref 5–15)
BUN: 14 mg/dL (ref 6–20)
CO2: 29 mmol/L (ref 22–32)
Calcium: 8.4 mg/dL — ABNORMAL LOW (ref 8.9–10.3)
Chloride: 100 mmol/L (ref 98–111)
Creatinine, Ser: 1.28 mg/dL — ABNORMAL HIGH (ref 0.44–1.00)
GFR, Estimated: 51 mL/min — ABNORMAL LOW (ref >60)
Glucose, Bld: 243 mg/dL — ABNORMAL HIGH (ref 70–99)
Potassium: 2.5 mmol/L — CL (ref 3.5–5.1)
Sodium: 136 mmol/L (ref 135–145)
Total Bilirubin: 0.6 mg/dL (ref 0.3–1.2)
Total Protein: 6.4 g/dL — ABNORMAL LOW (ref 6.5–8.1)

## 2022-10-06 LAB — URINALYSIS, ROUTINE W REFLEX MICROSCOPIC
Bilirubin Urine: NEGATIVE
Glucose, UA: >=500 mg/dL — AB
Hgb urine dipstick: NEGATIVE
Ketones, ur: NEGATIVE mg/dL
Leukocytes,Ua: NEGATIVE
Nitrite: NEGATIVE
Protein, ur: >=300 mg/dL — AB
Specific Gravity, Urine: 1.011 (ref 1.005–1.030)
pH: 7 (ref 5.0–8.0)

## 2022-10-06 LAB — LIPASE, BLOOD: Lipase: 28 U/L (ref 11–51)

## 2022-10-06 LAB — CBC
HCT: 35.9 % — ABNORMAL LOW (ref 36.0–46.0)
Hemoglobin: 11 g/dL — ABNORMAL LOW (ref 12.0–15.0)
MCH: 22.7 pg — ABNORMAL LOW (ref 26.0–34.0)
MCHC: 30.6 g/dL (ref 30.0–36.0)
MCV: 74.2 fL — ABNORMAL LOW (ref 80.0–100.0)
Platelets: 346 10*3/uL (ref 150–400)
RBC: 4.84 MIL/uL (ref 3.87–5.11)
RDW: 14.8 % (ref 11.5–15.5)
WBC: 6.9 10*3/uL (ref 4.0–10.5)
nRBC: 0 % (ref 0.0–0.2)

## 2022-10-06 LAB — MAGNESIUM: Magnesium: 1.8 mg/dL (ref 1.7–2.4)

## 2022-10-06 MED ORDER — PANTOPRAZOLE SODIUM 20 MG PO TBEC: 20.0000 mg | DELAYED_RELEASE_TABLET | Freq: Every day | ORAL | 1 refills | Status: AC

## 2022-10-06 MED ORDER — LIDOCAINE VISCOUS HCL 2 % MT SOLN: 15.0000 mL | OROMUCOSAL | 0 refills | Status: DC | PRN

## 2022-10-06 MED ORDER — LIDOCAINE VISCOUS HCL 2 % MT SOLN
15.0000 mL | Freq: Once | OROMUCOSAL | Status: AC
  Administered 2022-10-06: 15 mL via ORAL
  Filled 2022-10-06: qty 15

## 2022-10-06 MED ORDER — POTASSIUM CHLORIDE CRYS ER 20 MEQ PO TBCR: 20.0000 meq | EXTENDED_RELEASE_TABLET | Freq: Every day | ORAL | 0 refills | Status: DC

## 2022-10-06 MED ORDER — ALUM & MAG HYDROXIDE-SIMETH 200-200-20 MG/5ML PO SUSP
30.0000 mL | Freq: Once | ORAL | Status: AC
  Administered 2022-10-06: 30 mL via ORAL
  Filled 2022-10-06: qty 30

## 2022-10-06 NOTE — ED Notes (Signed)
Pt reports instant relief after taking GI cocktail. Pt in NAD on personal phone with friend/family.

## 2022-10-06 NOTE — Discharge Instructions (Signed)
Please seek medical attention for any high fevers, chest pain, shortness of breath, change in behavior, persistent vomiting, bloody stool or any other new or concerning symptoms.  

## 2022-10-06 NOTE — ED Notes (Signed)
Called lab who confirms that mag is currently running and will result soon.

## 2022-10-06 NOTE — ED Triage Notes (Signed)
Pt comes with c/o belly pain. Pt states dx with pancreatitis in October. Pt states some nausea

## 2022-10-06 NOTE — ED Provider Triage Note (Signed)
Emergency Medicine Provider Triage Evaluation Note  Shannon Cox , a 52 y.o. female  was evaluated in triage.  Pt complains of epigastric pain, hx pancreatitis, feels the same, no fever.  Review of Systems  Positive:  Negative:   Physical Exam  BP (!) 177/84   Pulse 87   Temp 98.4 F (36.9 C)   Resp 18   SpO2 100%  Gen:   Awake, no distress   Resp:  Normal effort  MSK:   Moves extremities without difficulty  Other:    Medical Decision Making  Medically screening exam initiated at 12:17 PM.  Appropriate orders placed.  Shannon Cox was informed that the remainder of the evaluation will be completed by another provider, this initial triage assessment does not replace that evaluation, and the importance of remaining in the ED until their evaluation is complete.     Versie Starks, PA-C 10/06/22 1217

## 2022-10-06 NOTE — ED Provider Notes (Signed)
[facility] Provider Note    Event Date/Time   First MD Initiated Contact with Patient 10/06/22 1234     (approximate)   History   Abdominal Pain   HPI  Shannon Cox is a 52 y.o. female who presents to the emergency department today because of concerns for continued abdominal pain.  Patient states that she first started having the abdominal pain a couple of months ago.  She says that she has been seen in the emergency department for this and was told that it could be pancreatitis since her lipase was slightly elevated.  She has had CT imaging for this pain.  She has an appointment scheduled with GI but has not for couple months.  Over the past 2 days she feels like her symptoms have been worse.  Pain is located in the epigastric region.  It does cause decreased appetite.     Physical Exam   Triage Vital Signs: ED Triage Vitals  Enc Vitals Group     BP 10/06/22 1216 (!) 177/84     Pulse Rate 10/06/22 1216 87     Resp 10/06/22 1216 18     Temp 10/06/22 1216 98.4 F (36.9 C)     Temp src --      SpO2 10/06/22 1216 100 %     Weight --      Height --      Head Circumference --      Peak Flow --      Pain Score 10/06/22 1215 6     Pain Loc --      Pain Edu? --      Excl. in [place]? --     Most recent vital signs: Vitals:   10/06/22 1216  BP: (!) 177/84  Pulse: 87  Resp: 18  Temp: 98.4 F (36.9 C)  SpO2: 100%   General: Awake, alert, oriented. CV:  Good peripheral perfusion. Regular rate and rhythm. Resp:  Normal effort. Lungs clear. Abd:  No distention. Tender to palpation in the epigastric region.   ED Results / Procedures / Treatments   Labs (all labs ordered are listed, but only abnormal results are displayed) Labs Reviewed  COMPREHENSIVE METABOLIC PANEL - Abnormal; Notable for the following components:      Result Value   Potassium 2.5 (*)    Glucose, Bld 243 (*)    Creatinine, Ser 1.28 (*)    Calcium 8.4 (*)    Total  Protein 6.4 (*)    Albumin 2.3 (*)    GFR, Estimated 51 (*)    All other components within normal limits  CBC - Abnormal; Notable for the following components:   Hemoglobin 11.0 (*)    HCT 35.9 (*)    MCV 74.2 (*)    MCH 22.7 (*)    All other components within normal limits  URINALYSIS, ROUTINE W REFLEX MICROSCOPIC - Abnormal; Notable for the following components:   Color, Urine YELLOW (*)    APPearance HAZY (*)    Glucose, UA >=500 (*)    Protein, ur >=300 (*)    Bacteria, UA RARE (*)    All other components within normal limits  LIPASE, BLOOD  MAGNESIUM     EKG  None   RADIOLOGY None   PROCEDURES:  Critical Care performed: No  Procedures   MEDICATIONS ORDERED IN ED: Medications - No data to display   IMPRESSION / MDM / [place] / ED COURSE  I reviewed  the triage vital signs and the nursing notes.                              Differential diagnosis includes, but is not limited to, pancreatitis, gastritis, gastroenteritis.  Patient's presentation is most consistent with acute presentation with potential threat to life or bodily function.  Patient presented to the emergency department today with continued abdominal pain. Blood work without any leukocytosis. No fever here. Did have some tenderness to the epigastric region. Has had imaging in the past without clear etiology. Lipase normal here today. The patient was given GI cocktail which did help alleviate the patient's pain. At this time I do think gastritis likely. Will plan on discharging with antacid and viscous lidocaine prescriptions.  Patient's potassium was also low. She states she has potassium pills at home. Did encourage patient to restart. Think potassium being low is due to poor oral intake.   FINAL CLINICAL IMPRESSION(S) / ED DIAGNOSES   Final diagnoses:  Generalized abdominal pain  Gastritis, presence of bleeding unspecified, unspecified chronicity, unspecified gastritis type      Note:  This document was prepared using Dragon voice recognition software and may include unintentional dictation errors.    Nance Pear, MD 10/06/22 [phone]

## 2023-02-14 ENCOUNTER — Other Ambulatory Visit: Payer: Self-pay

## 2023-02-14 ENCOUNTER — Emergency Department: Payer: BLUE CROSS/BLUE SHIELD

## 2023-02-14 ENCOUNTER — Emergency Department
Admission: EM | Admit: 2023-02-14 | Discharge: 2023-02-14 | Disposition: A | Discharge status: Home / Self Care | Payer: BLUE CROSS/BLUE SHIELD | Attending: Emergency Medicine | Admitting: Emergency Medicine

## 2023-02-14 DIAGNOSIS — R600 Localized edema: Secondary | ICD-10-CM | POA: Insufficient documentation

## 2023-02-14 LAB — COMPREHENSIVE METABOLIC PANEL
ALT: 13 U/L (ref 0–44)
AST: 17 U/L (ref 15–41)
Albumin: 2.5 g/dL — ABNORMAL LOW (ref 3.5–5.0)
Alkaline Phosphatase: 81 U/L (ref 38–126)
Anion gap: 8 (ref 5–15)
BUN: 41 mg/dL — ABNORMAL HIGH (ref 6–20)
CO2: 23 mmol/L (ref 22–32)
Calcium: 8.4 mg/dL — ABNORMAL LOW (ref 8.9–10.3)
Chloride: 107 mmol/L (ref 98–111)
Creatinine, Ser: 1.65 mg/dL — ABNORMAL HIGH (ref 0.44–1.00)
GFR, Estimated: 37 mL/min — ABNORMAL LOW (ref >60)
Glucose, Bld: 108 mg/dL — ABNORMAL HIGH (ref 70–99)
Potassium: 3.5 mmol/L (ref 3.5–5.1)
Sodium: 138 mmol/L (ref 135–145)
Total Bilirubin: 0.5 mg/dL (ref 0.3–1.2)
Total Protein: 6.5 g/dL (ref 6.5–8.1)

## 2023-02-14 LAB — CBC WITH DIFFERENTIAL/PLATELET
Abs Immature Granulocytes: 0.02 10*3/uL (ref 0.00–0.07)
Basophils Absolute: 0 10*3/uL (ref 0.0–0.1)
Basophils Relative: 0 %
Eosinophils Absolute: 0.4 10*3/uL (ref 0.0–0.5)
Eosinophils Relative: 5 %
HCT: 30.4 % — ABNORMAL LOW (ref 36.0–46.0)
Hemoglobin: 9.3 g/dL — ABNORMAL LOW (ref 12.0–15.0)
Immature Granulocytes: 0 %
Lymphocytes Relative: 24 %
Lymphs Abs: 2 10*3/uL (ref 0.7–4.0)
MCH: 22.6 pg — ABNORMAL LOW (ref 26.0–34.0)
MCHC: 30.6 g/dL (ref 30.0–36.0)
MCV: 73.8 fL — ABNORMAL LOW (ref 80.0–100.0)
Monocytes Absolute: 0.5 10*3/uL (ref 0.1–1.0)
Monocytes Relative: 6 %
Neutro Abs: 5.3 10*3/uL (ref 1.7–7.7)
Neutrophils Relative %: 65 %
Platelets: 342 10*3/uL (ref 150–400)
RBC: 4.12 MIL/uL (ref 3.87–5.11)
RDW: 17.3 % — ABNORMAL HIGH (ref 11.5–15.5)
WBC: 8.2 10*3/uL (ref 4.0–10.5)
nRBC: 0 % (ref 0.0–0.2)

## 2023-02-14 LAB — BRAIN NATRIURETIC PEPTIDE: B Natriuretic Peptide: 46 pg/mL (ref 0.0–100.0)

## 2023-02-14 MED ORDER — FUROSEMIDE 20 MG PO TABS: 20.0000 mg | ORAL_TABLET | Freq: Every day | ORAL | 0 refills | Status: DC

## 2023-02-14 MED ORDER — FUROSEMIDE 10 MG/ML IJ SOLN
60.0000 mg | Freq: Once | INTRAMUSCULAR | Status: AC
  Administered 2023-02-14: 60 mg via INTRAVENOUS
  Filled 2023-02-14: qty 8

## 2023-02-14 NOTE — ED Triage Notes (Deleted)
Pt reports while playing volleyball he felt and heard a pop in his L calf. Pt utilizing wheelchair in lobby. Alert and oriented following commands. Breathing unlabored speaking in full sentences with symmetric chest rise/fall.  

## 2023-02-14 NOTE — Discharge Instructions (Signed)
Please seek medical attention for any high fevers, chest pain, shortness of breath, change in behavior, persistent vomiting, bloody stool or any other new or concerning symptoms.  

## 2023-02-14 NOTE — ED Triage Notes (Signed)
Bilateral lower extremity swelling x 3 days. Denies any SOB, denies chest pain. PT states painful and hurts worse to walk.

## 2023-02-14 NOTE — ED Triage Notes (Signed)
Pt has not had bp medication today

## 2023-02-14 NOTE — ED Provider Notes (Signed)
[facility] Provider Note    Event Date/Time   First MD Initiated Contact with Patient 02/14/23 2157     (approximate)   History   Peripheral Edema  HPI  Shannon Cox is a 52 y.o. female  who presents to the emergency department today because of concern of leg swelling. She states that it has been getting worse for the past three days. She has had history of leg swelling in the past but it usually goes down after a night of keeping them elevated. She denies any new pain in either leg (has chronic neuropathy). States she was started on lyrica a couple of weeks ago and thinks that might have caused the swelling.      Physical Exam   Triage Vital Signs: ED Triage Vitals  Enc Vitals Group     BP 02/14/23 2051 (!) 170/77     Pulse Rate 02/14/23 2051 95     Resp 02/14/23 2051 18     Temp 02/14/23 2051 98.5 F (36.9 C)     Temp Source 02/14/23 2040 Oral     SpO2 02/14/23 2051 98 %     Weight 02/14/23 2044 246 lb (111.6 kg)     Height 02/14/23 2044 5\' 5"  (1.651 m)     Head Circumference --      Peak Flow --      Pain Score 02/14/23 2044 8     Pain Loc --      Pain Edu? --      Excl. in [place]? --     Most recent vital signs: Vitals:   02/14/23 2051  BP: (!) 170/77  Pulse: 95  Resp: 18  Temp: 98.5 F (36.9 C)  SpO2: 98%   General: Awake, alert, oriented. CV:  Good peripheral perfusion. Regular rate and rhythm. Resp:  Normal effort. Lungs clear. Abd:  No distention.  Other:  Bilateral lower extremity edema.    ED Results / Procedures / Treatments   Labs (all labs ordered are listed, but only abnormal results are displayed) Labs Reviewed  COMPREHENSIVE METABOLIC PANEL - Abnormal; Notable for the following components:      Result Value   Glucose, Bld 108 (*)    BUN 41 (*)    Creatinine, Ser 1.65 (*)    Calcium 8.4 (*)    Albumin 2.5 (*)    GFR, Estimated 37 (*)    All other components within normal limits  CBC WITH  DIFFERENTIAL/PLATELET - Abnormal; Notable for the following components:   Hemoglobin 9.3 (*)    HCT 30.4 (*)    MCV 73.8 (*)    MCH 22.6 (*)    RDW 17.3 (*)    All other components within normal limits  BRAIN NATRIURETIC PEPTIDE    EKG  None   RADIOLOGY None  PROCEDURES:  Critical Care performed: No   MEDICATIONS ORDERED IN ED: Medications - No data to display   IMPRESSION / MDM / ASSESSMENT AND PLAN / ED COURSE  I reviewed the triage vital signs and the nursing notes.                              Differential diagnosis includes, but is not limited to, heart failure, kidney disease, venous insuffiencey, dvt  Patient's presentation is most consistent with acute presentation with potential threat to life or bodily function.   Patient present to the emergency department today because  of concerns for bilateral lower extremity edema.  On exam she does have pitting edema to both legs.  No tenderness.  No warmth.  Blood work here without concerning BNP elevation.  Per chart review history of diarrhea.  Do wonder if this could be contributing to the edema.  At this time will plan on giving patient dose of IV Lasix here and short course of Lasix as a prescription.  Discussed with patient importance of following up with her nephrologist.   FINAL CLINICAL IMPRESSION(S) / ED DIAGNOSES   Final diagnoses:  Peripheral edema     Note:  This document was prepared using Dragon voice recognition software and may include unintentional dictation errors.    Phineas Semen, MD 02/14/23 2251

## 2023-03-03 NOTE — Progress Notes (Deleted)
LMP 01/15/2023 (Approximate)    Subjective:    Patient ID: Shannon Cox, female    DOB: [birth year], 52 y.o.   MRN: [id number]  HPI: [name] is a 52 y.o. female  No chief complaint on file.  Establish care: her last physical was ***.  Medical history includes ***.  Family history includes ***.  Health maintenance ***.   Diabetes:  HTN:   Relevant past medical, surgical, family and social history reviewed and updated as indicated. Interim medical history since our last visit reviewed. Allergies and medications reviewed and updated.  Review of Systems  Constitutional: Negative for fever or weight change.  Respiratory: Negative for cough and shortness of breath.   Cardiovascular: Negative for chest pain or palpitations.  Gastrointestinal: Negative for abdominal pain, no bowel changes.  Musculoskeletal: Negative for gait problem or joint swelling.  Skin: Negative for rash.  Neurological: Negative for dizziness or headache.  No other specific complaints in a complete review of systems (except as listed in HPI above).      Objective:    LMP 01/15/2023 (Approximate)   Wt Readings from Last 3 Encounters:  02/14/23 246 lb (111.6 kg)  10/06/22 265 lb 14 oz (120.6 kg)  09/16/22 266 lb (120.7 kg)    Physical Exam  Constitutional: Patient appears well-developed and well-nourished. Obese *** No distress.  HEENT: head atraumatic, normocephalic, pupils equal and reactive to light, ears ***, neck supple, throat within normal limits Cardiovascular: Normal rate, regular rhythm and normal heart sounds.  No murmur heard. No BLE edema. Pulmonary/Chest: Effort normal and breath sounds normal. No respiratory distress. Abdominal: Soft.  There is no tenderness. Psychiatric: Patient has a normal mood and affect. behavior is normal. Judgment and thought content normal.  Results for orders placed or performed during the hospital encounter of 02/14/23  Brain natriuretic  peptide  Result Value Ref Range   B Natriuretic Peptide 46.0 0.0 - 100.0 pg/mL  Comprehensive metabolic panel  Result Value Ref Range   Sodium 138 135 - 145 mmol/L   Potassium 3.5 3.5 - 5.1 mmol/L   Chloride 107 98 - 111 mmol/L   CO2 23 22 - 32 mmol/L   Glucose, Bld 108 (H) 70 - 99 mg/dL   BUN 41 (H) 6 - 20 mg/dL   Creatinine, Ser 4.09 (H) 0.44 - 1.00 mg/dL   Calcium 8.4 (L) 8.9 - 10.3 mg/dL   Total Protein 6.5 6.5 - 8.1 g/dL   Albumin 2.5 (L) 3.5 - 5.0 g/dL   AST 17 15 - 41 U/L   ALT 13 0 - 44 U/L   Alkaline Phosphatase 81 38 - 126 U/L   Total Bilirubin 0.5 0.3 - 1.2 mg/dL   GFR, Estimated 37 (L) >60 mL/min   Anion gap 8 5 - 15  CBC with Differential  Result Value Ref Range   WBC 8.2 4.0 - 10.5 K/uL   RBC 4.12 3.87 - 5.11 MIL/uL   Hemoglobin 9.3 (L) 12.0 - 15.0 g/dL   HCT 81.1 (L) 91.4 - 78.2 %   MCV 73.8 (L) 80.0 - 100.0 fL   MCH 22.6 (L) 26.0 - 34.0 pg   MCHC 30.6 30.0 - 36.0 g/dL   RDW 95.6 (H) 21.3 - 08.6 %   Platelets 342 150 - 400 K/uL   nRBC 0.0 0.0 - 0.2 %   Neutrophils Relative % 65 %   Neutro Abs 5.3 1.7 - 7.7 K/uL   Lymphocytes Relative 24 %   Lymphs  Abs 2.0 0.7 - 4.0 K/uL   Monocytes Relative 6 %   Monocytes Absolute 0.5 0.1 - 1.0 K/uL   Eosinophils Relative 5 %   Eosinophils Absolute 0.4 0.0 - 0.5 K/uL   Basophils Relative 0 %   Basophils Absolute 0.0 0.0 - 0.1 K/uL   Immature Granulocytes 0 %   Abs Immature Granulocytes 0.02 0.00 - 0.07 K/uL      Assessment & Plan:   Problem List Items Addressed This Visit   None    Follow up plan: No follow-ups on file.

## 2023-03-04 ENCOUNTER — Ambulatory Visit: Payer: BLUE CROSS/BLUE SHIELD | Admitting: Nurse Practitioner

## 2023-03-18 DIAGNOSIS — I3139 Other pericardial effusion (noninflammatory): Secondary | ICD-10-CM | POA: Insufficient documentation

## 2023-03-18 DIAGNOSIS — I10 Essential (primary) hypertension: Secondary | ICD-10-CM | POA: Insufficient documentation

## 2023-03-18 DIAGNOSIS — N1831 Chronic kidney disease, stage 3a: Secondary | ICD-10-CM | POA: Insufficient documentation

## 2023-03-25 DIAGNOSIS — N1832 Chronic kidney disease, stage 3b: Secondary | ICD-10-CM | POA: Diagnosis present

## 2023-03-25 DIAGNOSIS — D631 Anemia in chronic kidney disease: Secondary | ICD-10-CM | POA: Diagnosis present

## 2023-06-30 ENCOUNTER — Other Ambulatory Visit: Payer: Self-pay

## 2023-06-30 ENCOUNTER — Inpatient Hospital Stay
Admission: EM | Admit: 2023-06-30 | Discharge: 2023-07-08 | DRG: 682 | Disposition: A | Discharge status: Home / Self Care | Payer: BLUE CROSS/BLUE SHIELD | Attending: Internal Medicine | Admitting: Internal Medicine

## 2023-06-30 ENCOUNTER — Emergency Department: Payer: BLUE CROSS/BLUE SHIELD

## 2023-06-30 ENCOUNTER — Encounter: Payer: Self-pay | Admitting: *Deleted

## 2023-06-30 DIAGNOSIS — I5031 Acute diastolic (congestive) heart failure: Secondary | ICD-10-CM | POA: Diagnosis present

## 2023-06-30 DIAGNOSIS — E119 Type 2 diabetes mellitus without complications: Secondary | ICD-10-CM

## 2023-06-30 DIAGNOSIS — N179 Acute kidney failure, unspecified: Secondary | ICD-10-CM | POA: Diagnosis not present

## 2023-06-30 DIAGNOSIS — E1142 Type 2 diabetes mellitus with diabetic polyneuropathy: Secondary | ICD-10-CM | POA: Diagnosis present

## 2023-06-30 DIAGNOSIS — I160 Hypertensive urgency: Secondary | ICD-10-CM | POA: Diagnosis present

## 2023-06-30 DIAGNOSIS — Z8249 Family history of ischemic heart disease and other diseases of the circulatory system: Secondary | ICD-10-CM

## 2023-06-30 DIAGNOSIS — Z79899 Other long term (current) drug therapy: Secondary | ICD-10-CM

## 2023-06-30 DIAGNOSIS — Z1611 Resistance to penicillins: Secondary | ICD-10-CM | POA: Diagnosis present

## 2023-06-30 DIAGNOSIS — E876 Hypokalemia: Secondary | ICD-10-CM | POA: Diagnosis present

## 2023-06-30 DIAGNOSIS — E1140 Type 2 diabetes mellitus with diabetic neuropathy, unspecified: Secondary | ICD-10-CM | POA: Insufficient documentation

## 2023-06-30 DIAGNOSIS — E785 Hyperlipidemia, unspecified: Secondary | ICD-10-CM | POA: Diagnosis present

## 2023-06-30 DIAGNOSIS — N1832 Chronic kidney disease, stage 3b: Secondary | ICD-10-CM | POA: Diagnosis present

## 2023-06-30 DIAGNOSIS — R0602 Shortness of breath: Principal | ICD-10-CM

## 2023-06-30 DIAGNOSIS — Z7984 Long term (current) use of oral hypoglycemic drugs: Secondary | ICD-10-CM

## 2023-06-30 DIAGNOSIS — R7989 Other specified abnormal findings of blood chemistry: Secondary | ICD-10-CM | POA: Insufficient documentation

## 2023-06-30 DIAGNOSIS — M21371 Foot drop, right foot: Secondary | ICD-10-CM | POA: Diagnosis present

## 2023-06-30 DIAGNOSIS — E11319 Type 2 diabetes mellitus with unspecified diabetic retinopathy without macular edema: Secondary | ICD-10-CM | POA: Diagnosis present

## 2023-06-30 DIAGNOSIS — E1122 Type 2 diabetes mellitus with diabetic chronic kidney disease: Secondary | ICD-10-CM | POA: Diagnosis present

## 2023-06-30 DIAGNOSIS — E8770 Fluid overload, unspecified: Secondary | ICD-10-CM | POA: Diagnosis present

## 2023-06-30 DIAGNOSIS — B9620 Unspecified Escherichia coli [E. coli] as the cause of diseases classified elsewhere: Secondary | ICD-10-CM | POA: Diagnosis present

## 2023-06-30 DIAGNOSIS — I509 Heart failure, unspecified: Secondary | ICD-10-CM

## 2023-06-30 DIAGNOSIS — G4733 Obstructive sleep apnea (adult) (pediatric): Secondary | ICD-10-CM | POA: Insufficient documentation

## 2023-06-30 DIAGNOSIS — E66813 Obesity, class 3: Secondary | ICD-10-CM | POA: Insufficient documentation

## 2023-06-30 DIAGNOSIS — F064 Anxiety disorder due to known physiological condition: Secondary | ICD-10-CM | POA: Diagnosis not present

## 2023-06-30 DIAGNOSIS — N049 Nephrotic syndrome with unspecified morphologic changes: Secondary | ICD-10-CM

## 2023-06-30 DIAGNOSIS — E1165 Type 2 diabetes mellitus with hyperglycemia: Secondary | ICD-10-CM | POA: Diagnosis present

## 2023-06-30 DIAGNOSIS — Z6841 Body Mass Index (BMI) 40.0 and over, adult: Secondary | ICD-10-CM

## 2023-06-30 DIAGNOSIS — Z87891 Personal history of nicotine dependence: Secondary | ICD-10-CM

## 2023-06-30 DIAGNOSIS — D631 Anemia in chronic kidney disease: Secondary | ICD-10-CM | POA: Diagnosis present

## 2023-06-30 DIAGNOSIS — Z888 Allergy status to other drugs, medicaments and biological substances status: Secondary | ICD-10-CM

## 2023-06-30 DIAGNOSIS — D519 Vitamin B12 deficiency anemia, unspecified: Secondary | ICD-10-CM | POA: Diagnosis present

## 2023-06-30 DIAGNOSIS — I2489 Other forms of acute ischemic heart disease: Secondary | ICD-10-CM | POA: Diagnosis present

## 2023-06-30 DIAGNOSIS — E8809 Other disorders of plasma-protein metabolism, not elsewhere classified: Secondary | ICD-10-CM | POA: Diagnosis present

## 2023-06-30 DIAGNOSIS — I130 Hypertensive heart and chronic kidney disease with heart failure and stage 1 through stage 4 chronic kidney disease, or unspecified chronic kidney disease: Principal | ICD-10-CM | POA: Diagnosis present

## 2023-06-30 DIAGNOSIS — E559 Vitamin D deficiency, unspecified: Secondary | ICD-10-CM | POA: Diagnosis present

## 2023-06-30 DIAGNOSIS — Z794 Long term (current) use of insulin: Secondary | ICD-10-CM

## 2023-06-30 DIAGNOSIS — D529 Folate deficiency anemia, unspecified: Secondary | ICD-10-CM | POA: Diagnosis present

## 2023-06-30 DIAGNOSIS — K861 Other chronic pancreatitis: Secondary | ICD-10-CM | POA: Diagnosis present

## 2023-06-30 DIAGNOSIS — Z882 Allergy status to sulfonamides status: Secondary | ICD-10-CM

## 2023-06-30 LAB — BASIC METABOLIC PANEL
Anion gap: 9 (ref 5–15)
BUN: 31 mg/dL — ABNORMAL HIGH (ref 6–20)
CO2: 21 mmol/L — ABNORMAL LOW (ref 22–32)
Calcium: 8.3 mg/dL — ABNORMAL LOW (ref 8.9–10.3)
Chloride: 108 mmol/L (ref 98–111)
Creatinine, Ser: 3.33 mg/dL — ABNORMAL HIGH (ref 0.44–1.00)
GFR, Estimated: 16 mL/min — ABNORMAL LOW (ref >60)
Glucose, Bld: 116 mg/dL — ABNORMAL HIGH (ref 70–99)
Potassium: 3.4 mmol/L — ABNORMAL LOW (ref 3.5–5.1)
Sodium: 138 mmol/L (ref 135–145)

## 2023-06-30 LAB — CBC
HCT: 32.3 % — ABNORMAL LOW (ref 36.0–46.0)
Hemoglobin: 9.8 g/dL — ABNORMAL LOW (ref 12.0–15.0)
MCH: 23.5 pg — ABNORMAL LOW (ref 26.0–34.0)
MCHC: 30.3 g/dL (ref 30.0–36.0)
MCV: 77.5 fL — ABNORMAL LOW (ref 80.0–100.0)
Platelets: 331 10*3/uL (ref 150–400)
RBC: 4.17 MIL/uL (ref 3.87–5.11)
RDW: 20.6 % — ABNORMAL HIGH (ref 11.5–15.5)
WBC: 7.2 10*3/uL (ref 4.0–10.5)
nRBC: 0 % (ref 0.0–0.2)

## 2023-06-30 LAB — TROPONIN I (HIGH SENSITIVITY)
Troponin I (High Sensitivity): 22 ng/L — ABNORMAL HIGH (ref <18)
Troponin I (High Sensitivity): 27 ng/L — ABNORMAL HIGH (ref <18)

## 2023-06-30 MED ORDER — FUROSEMIDE 10 MG/ML IJ SOLN
40.0000 mg | Freq: Once | INTRAMUSCULAR | Status: AC
  Administered 2023-06-30: 40 mg via INTRAVENOUS
  Filled 2023-06-30: qty 4

## 2023-06-30 NOTE — ED Triage Notes (Addendum)
Pt to triage via wheelchair.  Pt has sob.  No chest pain   sx for 4 months.  Pt had endoscopy yesterday at [facility].   Pt reports swelling to lower legs.  Pt alert  speech clear.

## 2023-06-30 NOTE — ED Provider Notes (Signed)
[facility] Provider Note    Event Date/Time   First MD Initiated Contact with Patient 06/30/23 2301     (approximate)   History   Chief Complaint Shortness of Breath   HPI  Shannon Cox is a 52 y.o. female with past medical history of hypertension, diabetes, and CKD who presents to the ED complaining of shortness of breath.  Patient reports that she began feeling short of breath earlier this evening while laying flat at home.  She denies any associated chest pain, symptoms are improved here in the ED while she is sitting upright but she still feels slightly short of breath.  She denies any fevers or cough, does state that her legs have been increasingly swollen over the past week.  She takes Lasix regularly but denies any history of heart failure.  She states that she continues to urinate normally.     Physical Exam   Triage Vital Signs: ED Triage Vitals  Encounter Vitals Group     BP 06/30/23 1804 (!) 173/81     Systolic BP Percentile --      Diastolic BP Percentile --      Pulse Rate 06/30/23 1804 92     Resp 06/30/23 1804 20     Temp 06/30/23 1804 98.5 F (36.9 C)     Temp Source 06/30/23 1804 Oral     SpO2 06/30/23 1804 94 %     Weight 06/30/23 1800 254 lb (115.2 kg)     Height 06/30/23 1800 5\' 5"  (1.651 m)     Head Circumference --      Peak Flow --      Pain Score 06/30/23 1800 0     Pain Loc --      Pain Education --      Exclude from Growth Chart --     Most recent vital signs: Vitals:   06/30/23 2230 06/30/23 2300  BP: (!) 169/62 (!) 185/64  Pulse: 91 92  Resp:  20  Temp:  98.4 F (36.9 C)  SpO2: 97% 97%    Constitutional: Alert and oriented. Eyes: Conjunctivae are normal. Head: Atraumatic. Nose: No congestion/rhinnorhea. Mouth/Throat: Mucous membranes are moist.  Cardiovascular: Normal rate, regular rhythm. Grossly normal heart sounds.  2+ radial pulses bilaterally. Respiratory: Normal respiratory effort.  No  retractions. Lungs CTAB. Gastrointestinal: Soft and nontender. No distention. Musculoskeletal: No lower extremity tenderness, 2+ pitting edema to knees bilaterally. Neurologic:  Normal speech and language. No gross focal neurologic deficits are appreciated.    ED Results / Procedures / Treatments   Labs (all labs ordered are listed, but only abnormal results are displayed) Labs Reviewed  BASIC METABOLIC PANEL - Abnormal; Notable for the following components:      Result Value   Potassium 3.4 (*)    CO2 21 (*)    Glucose, Bld 116 (*)    BUN 31 (*)    Creatinine, Ser 3.33 (*)    Calcium 8.3 (*)    GFR, Estimated 16 (*)    All other components within normal limits  CBC - Abnormal; Notable for the following components:   Hemoglobin 9.8 (*)    HCT 32.3 (*)    MCV 77.5 (*)    MCH 23.5 (*)    RDW 20.6 (*)    All other components within normal limits  TROPONIN I (HIGH SENSITIVITY) - Abnormal; Notable for the following components:   Troponin I (High Sensitivity) 22 (*)    All  other components within normal limits  TROPONIN I (HIGH SENSITIVITY) - Abnormal; Notable for the following components:   Troponin I (High Sensitivity) 27 (*)    All other components within normal limits  BRAIN NATRIURETIC PEPTIDE     EKG  ED ECG REPORT I, Chesley Noon, the attending physician, personally viewed and interpreted this ECG.   Date: 06/30/2023  EKG Time: 18:12  Rate: 95  Rhythm: normal sinus rhythm  Axis: Normal  Intervals:none  ST&T Change: None  RADIOLOGY Chest x-ray reviewed and interpreted by me with no infiltrate, edema, or effusion.  PROCEDURES:  Critical Care performed: No  Procedures   MEDICATIONS ORDERED IN ED: Medications  furosemide (LASIX) injection 40 mg (has no administration in time range)     IMPRESSION / MDM / ASSESSMENT AND PLAN / ED COURSE  I reviewed the triage vital signs and the nursing notes.                              52 y.o. female with past  medical history of hypertension, diabetes, and CKD who presents to the ED complaining of difficulty breathing while lying flat starting today with increased swelling in her legs for the past week.  Patient's presentation is most consistent with acute presentation with potential threat to life or bodily function.  Differential diagnosis includes, but is not limited to, CHF, ACS, PE, pneumonia, pneumothorax, viral illness, COPD, anemia, electrolyte abnormality, AKI.  Patient nontoxic-appearing and in no acute distress, vital signs are unremarkable.  She is not in any respiratory distress and maintaining oxygen saturations on room air, however she appears significantly fluid overloaded with lower extremity edema.  Chest x-ray shows cardiomegaly but no obvious pulmonary edema, will add on BNP and diurese with IV Lasix.  2 sets of troponin are stable and I doubt ACS or PE, anemia stable compared to previous with no significant leukocytosis.  She does have AKI but without acute electrolyte abnormality, would benefit from diuresis in the hospital given significant AKI.  Case discussed with hospitalist for admission.      FINAL CLINICAL IMPRESSION(S) / ED DIAGNOSES   Final diagnoses:  Shortness of breath  AKI (acute kidney injury) (HCC)     Rx / DC Orders   ED Discharge Orders     None        Note:  This document was prepared using Dragon voice recognition software and may include unintentional dictation errors.   Chesley Noon, MD 06/30/23 [phone]

## 2023-07-01 ENCOUNTER — Inpatient Hospital Stay (HOSPITAL_COMMUNITY)
Admit: 2023-07-01 | Discharge: 2023-07-01 | Disposition: A | Discharge status: Home / Self Care | Payer: BLUE CROSS/BLUE SHIELD | Attending: Internal Medicine | Admitting: Internal Medicine

## 2023-07-01 DIAGNOSIS — N170 Acute kidney failure with tubular necrosis: Secondary | ICD-10-CM | POA: Diagnosis not present

## 2023-07-01 DIAGNOSIS — E8770 Fluid overload, unspecified: Secondary | ICD-10-CM | POA: Diagnosis present

## 2023-07-01 DIAGNOSIS — I5031 Acute diastolic (congestive) heart failure: Secondary | ICD-10-CM | POA: Diagnosis present

## 2023-07-01 DIAGNOSIS — R0602 Shortness of breath: Secondary | ICD-10-CM | POA: Diagnosis present

## 2023-07-01 DIAGNOSIS — E1142 Type 2 diabetes mellitus with diabetic polyneuropathy: Secondary | ICD-10-CM | POA: Diagnosis present

## 2023-07-01 DIAGNOSIS — E11319 Type 2 diabetes mellitus with unspecified diabetic retinopathy without macular edema: Secondary | ICD-10-CM | POA: Diagnosis present

## 2023-07-01 DIAGNOSIS — N1832 Chronic kidney disease, stage 3b: Secondary | ICD-10-CM | POA: Diagnosis present

## 2023-07-01 DIAGNOSIS — Z794 Long term (current) use of insulin: Secondary | ICD-10-CM

## 2023-07-01 DIAGNOSIS — N179 Acute kidney failure, unspecified: Secondary | ICD-10-CM | POA: Diagnosis present

## 2023-07-01 DIAGNOSIS — D631 Anemia in chronic kidney disease: Secondary | ICD-10-CM | POA: Diagnosis present

## 2023-07-01 DIAGNOSIS — I130 Hypertensive heart and chronic kidney disease with heart failure and stage 1 through stage 4 chronic kidney disease, or unspecified chronic kidney disease: Secondary | ICD-10-CM | POA: Diagnosis present

## 2023-07-01 DIAGNOSIS — I160 Hypertensive urgency: Secondary | ICD-10-CM | POA: Diagnosis present

## 2023-07-01 DIAGNOSIS — I509 Heart failure, unspecified: Secondary | ICD-10-CM

## 2023-07-01 DIAGNOSIS — E876 Hypokalemia: Secondary | ICD-10-CM | POA: Diagnosis present

## 2023-07-01 DIAGNOSIS — D529 Folate deficiency anemia, unspecified: Secondary | ICD-10-CM | POA: Diagnosis present

## 2023-07-01 DIAGNOSIS — E1122 Type 2 diabetes mellitus with diabetic chronic kidney disease: Secondary | ICD-10-CM | POA: Diagnosis present

## 2023-07-01 DIAGNOSIS — E1121 Type 2 diabetes mellitus with diabetic nephropathy: Secondary | ICD-10-CM | POA: Diagnosis not present

## 2023-07-01 DIAGNOSIS — Z1611 Resistance to penicillins: Secondary | ICD-10-CM | POA: Diagnosis present

## 2023-07-01 DIAGNOSIS — E1165 Type 2 diabetes mellitus with hyperglycemia: Secondary | ICD-10-CM | POA: Diagnosis present

## 2023-07-01 DIAGNOSIS — E119 Type 2 diabetes mellitus without complications: Secondary | ICD-10-CM

## 2023-07-01 DIAGNOSIS — B9620 Unspecified Escherichia coli [E. coli] as the cause of diseases classified elsewhere: Secondary | ICD-10-CM | POA: Diagnosis present

## 2023-07-01 DIAGNOSIS — K861 Other chronic pancreatitis: Secondary | ICD-10-CM | POA: Diagnosis present

## 2023-07-01 DIAGNOSIS — Z6841 Body Mass Index (BMI) 40.0 and over, adult: Secondary | ICD-10-CM | POA: Diagnosis not present

## 2023-07-01 DIAGNOSIS — G4733 Obstructive sleep apnea (adult) (pediatric): Secondary | ICD-10-CM

## 2023-07-01 DIAGNOSIS — I10 Essential (primary) hypertension: Secondary | ICD-10-CM | POA: Diagnosis not present

## 2023-07-01 DIAGNOSIS — N049 Nephrotic syndrome with unspecified morphologic changes: Secondary | ICD-10-CM | POA: Diagnosis not present

## 2023-07-01 DIAGNOSIS — R7989 Other specified abnormal findings of blood chemistry: Secondary | ICD-10-CM | POA: Diagnosis not present

## 2023-07-01 DIAGNOSIS — N189 Chronic kidney disease, unspecified: Secondary | ICD-10-CM | POA: Diagnosis not present

## 2023-07-01 DIAGNOSIS — E785 Hyperlipidemia, unspecified: Secondary | ICD-10-CM | POA: Diagnosis present

## 2023-07-01 DIAGNOSIS — E8809 Other disorders of plasma-protein metabolism, not elsewhere classified: Secondary | ICD-10-CM | POA: Diagnosis present

## 2023-07-01 DIAGNOSIS — I2489 Other forms of acute ischemic heart disease: Secondary | ICD-10-CM | POA: Diagnosis present

## 2023-07-01 DIAGNOSIS — F064 Anxiety disorder due to known physiological condition: Secondary | ICD-10-CM | POA: Diagnosis not present

## 2023-07-01 DIAGNOSIS — E1140 Type 2 diabetes mellitus with diabetic neuropathy, unspecified: Secondary | ICD-10-CM | POA: Insufficient documentation

## 2023-07-01 LAB — ECHOCARDIOGRAM COMPLETE
AR max vel: 2.57 cm2
AV Area VTI: 2.55 cm2
AV Area mean vel: 2.48 cm2
AV Mean grad: 5 mmHg
AV Peak grad: 9.6 mmHg
Ao pk vel: 1.55 m/s
Area-P 1/2: 4.39 cm2
Height: 65 in
S' Lateral: 3.5 cm
Weight: 4064 oz

## 2023-07-01 LAB — URINALYSIS, COMPLETE (UACMP) WITH MICROSCOPIC
Bilirubin Urine: NEGATIVE
Glucose, UA: 150 mg/dL — AB
Ketones, ur: NEGATIVE mg/dL
Nitrite: NEGATIVE
Protein, ur: >=300 mg/dL — AB
Specific Gravity, Urine: 1.007 (ref 1.005–1.030)
WBC, UA: >50 WBC/hpf (ref 0–5)
pH: 5 (ref 5.0–8.0)

## 2023-07-01 LAB — CBC
HCT: 32.5 % — ABNORMAL LOW (ref 36.0–46.0)
Hemoglobin: 10.1 g/dL — ABNORMAL LOW (ref 12.0–15.0)
MCH: 23.7 pg — ABNORMAL LOW (ref 26.0–34.0)
MCHC: 31.1 g/dL (ref 30.0–36.0)
MCV: 76.3 fL — ABNORMAL LOW (ref 80.0–100.0)
Platelets: 315 10*3/uL (ref 150–400)
RBC: 4.26 MIL/uL (ref 3.87–5.11)
RDW: 20.4 % — ABNORMAL HIGH (ref 11.5–15.5)
WBC: 9.1 10*3/uL (ref 4.0–10.5)
nRBC: 0 % (ref 0.0–0.2)

## 2023-07-01 LAB — PROTEIN / CREATININE RATIO, URINE
Creatinine, Urine: 30 mg/dL
Protein Creatinine Ratio: 11.77 mg/mg{Cre} — ABNORMAL HIGH (ref 0.00–0.15)
Total Protein, Urine: 353 mg/dL

## 2023-07-01 LAB — GLUCOSE, CAPILLARY
Glucose-Capillary: 141 mg/dL — ABNORMAL HIGH (ref 70–99)
Glucose-Capillary: 132 mg/dL — ABNORMAL HIGH (ref 70–99)

## 2023-07-01 LAB — HEPATIC FUNCTION PANEL
ALT: 11 U/L (ref 0–44)
AST: 13 U/L — ABNORMAL LOW (ref 15–41)
Albumin: 2.2 g/dL — ABNORMAL LOW (ref 3.5–5.0)
Alkaline Phosphatase: 75 U/L (ref 38–126)
Bilirubin, Direct: <0.1 mg/dL (ref 0.0–0.2)
Total Bilirubin: 0.6 mg/dL (ref 0.3–1.2)
Total Protein: 6.4 g/dL — ABNORMAL LOW (ref 6.5–8.1)

## 2023-07-01 LAB — COMPREHENSIVE METABOLIC PANEL
ALT: 10 U/L (ref 0–44)
AST: 11 U/L — ABNORMAL LOW (ref 15–41)
Albumin: 2.1 g/dL — ABNORMAL LOW (ref 3.5–5.0)
Alkaline Phosphatase: 76 U/L (ref 38–126)
Anion gap: 6 (ref 5–15)
BUN: 33 mg/dL — ABNORMAL HIGH (ref 6–20)
CO2: 23 mmol/L (ref 22–32)
Calcium: 8.3 mg/dL — ABNORMAL LOW (ref 8.9–10.3)
Chloride: 109 mmol/L (ref 98–111)
Creatinine, Ser: 3.23 mg/dL — ABNORMAL HIGH (ref 0.44–1.00)
GFR, Estimated: 17 mL/min — ABNORMAL LOW (ref >60)
Glucose, Bld: 127 mg/dL — ABNORMAL HIGH (ref 70–99)
Potassium: 3 mmol/L — ABNORMAL LOW (ref 3.5–5.1)
Sodium: 138 mmol/L (ref 135–145)
Total Bilirubin: 0.2 mg/dL — ABNORMAL LOW (ref 0.3–1.2)
Total Protein: 6.2 g/dL — ABNORMAL LOW (ref 6.5–8.1)

## 2023-07-01 LAB — BASIC METABOLIC PANEL
Anion gap: 10 (ref 5–15)
BUN: 35 mg/dL — ABNORMAL HIGH (ref 6–20)
CO2: 22 mmol/L (ref 22–32)
Calcium: 8.1 mg/dL — ABNORMAL LOW (ref 8.9–10.3)
Chloride: 107 mmol/L (ref 98–111)
Creatinine, Ser: 3.14 mg/dL — ABNORMAL HIGH (ref 0.44–1.00)
GFR, Estimated: 17 mL/min — ABNORMAL LOW (ref >60)
Glucose, Bld: 140 mg/dL — ABNORMAL HIGH (ref 70–99)
Potassium: 4.1 mmol/L (ref 3.5–5.1)
Sodium: 139 mmol/L (ref 135–145)

## 2023-07-01 LAB — VITAMIN D 25 HYDROXY (VIT D DEFICIENCY, FRACTURES): Vit D, 25-Hydroxy: 27.8 ng/mL — ABNORMAL LOW (ref 30–100)

## 2023-07-01 LAB — HEMOGLOBIN A1C
Hgb A1c MFr Bld: 6.9 % — ABNORMAL HIGH (ref 4.8–5.6)
Mean Plasma Glucose: 151.33 mg/dL

## 2023-07-01 LAB — URINE DRUG SCREEN, QUALITATIVE (ARMC ONLY)
Amphetamines, Ur Screen: NOT DETECTED
Barbiturates, Ur Screen: NOT DETECTED
Benzodiazepine, Ur Scrn: NOT DETECTED
Cannabinoid 50 Ng, Ur ~~LOC~~: NOT DETECTED
Cocaine Metabolite,Ur ~~LOC~~: NOT DETECTED
MDMA (Ecstasy)Ur Screen: NOT DETECTED
Methadone Scn, Ur: NOT DETECTED
Opiate, Ur Screen: NOT DETECTED
Phencyclidine (PCP) Ur S: NOT DETECTED
Tricyclic, Ur Screen: NOT DETECTED

## 2023-07-01 LAB — CBG MONITORING, ED
Glucose-Capillary: 99 mg/dL (ref 70–99)
Glucose-Capillary: 136 mg/dL — ABNORMAL HIGH (ref 70–99)
Glucose-Capillary: 80 mg/dL (ref 70–99)

## 2023-07-01 LAB — IRON AND TIBC
Iron: 29 ug/dL (ref 28–170)
Saturation Ratios: 13 % (ref 10.4–31.8)
TIBC: 217 ug/dL — ABNORMAL LOW (ref 250–450)
UIBC: 188 ug/dL

## 2023-07-01 LAB — MAGNESIUM: Magnesium: 1.8 mg/dL (ref 1.7–2.4)

## 2023-07-01 LAB — FOLATE: Folate: 4.6 ng/mL — ABNORMAL LOW (ref >5.9)

## 2023-07-01 LAB — HIV ANTIBODY (ROUTINE TESTING W REFLEX): HIV Screen 4th Generation wRfx: NONREACTIVE

## 2023-07-01 LAB — BRAIN NATRIURETIC PEPTIDE: B Natriuretic Peptide: 250.6 pg/mL — ABNORMAL HIGH (ref 0.0–100.0)

## 2023-07-01 LAB — PHOSPHORUS: Phosphorus: 4.2 mg/dL (ref 2.5–4.6)

## 2023-07-01 LAB — VITAMIN B12: Vitamin B-12: 194 pg/mL (ref 180–914)

## 2023-07-01 LAB — PROTEIN, URINE, RANDOM: Total Protein, Urine: 394 mg/dL

## 2023-07-01 MED ORDER — INSULIN ASPART 100 UNIT/ML IJ SOLN
0.0000 [IU] | Freq: Three times a day (TID) | INTRAMUSCULAR | Status: DC
  Administered 2023-07-01 – 2023-07-05 (×4): 3 [IU] via SUBCUTANEOUS
  Administered 2023-07-08: 4 [IU] via SUBCUTANEOUS
  Filled 2023-07-01 (×8): qty 1

## 2023-07-01 MED ORDER — ROSUVASTATIN CALCIUM 10 MG PO TABS
20.0000 mg | ORAL_TABLET | Freq: Every day | ORAL | Status: DC
  Administered 2023-07-01 – 2023-07-08 (×8): 20 mg via ORAL
  Filled 2023-07-01 (×6): qty 2
  Filled 2023-07-01: qty 4
  Filled 2023-07-01: qty 1

## 2023-07-01 MED ORDER — METOPROLOL TARTRATE 50 MG PO TABS
50.0000 mg | ORAL_TABLET | Freq: Two times a day (BID) | ORAL | Status: DC
  Administered 2023-07-01 – 2023-07-03 (×5): 50 mg via ORAL
  Filled 2023-07-01: qty 1
  Filled 2023-07-01: qty 2
  Filled 2023-07-01 (×3): qty 1

## 2023-07-01 MED ORDER — INSULIN ASPART 100 UNIT/ML IJ SOLN: 0.0000 [IU] | Freq: Every day | INTRAMUSCULAR | Status: DC

## 2023-07-01 MED ORDER — ACETAMINOPHEN 650 MG RE SUPP: 650.0000 mg | Freq: Four times a day (QID) | RECTAL | Status: DC | PRN

## 2023-07-01 MED ORDER — FUROSEMIDE 10 MG/ML IJ SOLN
20.0000 mg | Freq: Two times a day (BID) | INTRAMUSCULAR | Status: DC
  Administered 2023-07-01: 20 mg via INTRAVENOUS
  Filled 2023-07-01: qty 4

## 2023-07-01 MED ORDER — POTASSIUM CHLORIDE CRYS ER 20 MEQ PO TBCR
20.0000 meq | EXTENDED_RELEASE_TABLET | Freq: Every day | ORAL | Status: DC
  Administered 2023-07-02 – 2023-07-03 (×2): 20 meq via ORAL
  Filled 2023-07-01 (×2): qty 1

## 2023-07-01 MED ORDER — INSULIN GLARGINE-YFGN 100 UNIT/ML ~~LOC~~ SOLN
20.0000 [IU] | Freq: Every day | SUBCUTANEOUS | Status: DC
  Administered 2023-07-01 – 2023-07-04 (×4): 20 [IU] via SUBCUTANEOUS
  Filled 2023-07-01 (×8): qty 0.2

## 2023-07-01 MED ORDER — ONDANSETRON HCL 4 MG/2ML IJ SOLN
4.0000 mg | Freq: Four times a day (QID) | INTRAMUSCULAR | Status: DC | PRN
  Administered 2023-07-01 – 2023-07-06 (×2): 4 mg via INTRAVENOUS
  Filled 2023-07-01: qty 2

## 2023-07-01 MED ORDER — POTASSIUM CHLORIDE CRYS ER 20 MEQ PO TBCR
40.0000 meq | EXTENDED_RELEASE_TABLET | Freq: Once | ORAL | Status: AC
  Administered 2023-07-01: 40 meq via ORAL
  Filled 2023-07-01: qty 2

## 2023-07-01 MED ORDER — ACETAMINOPHEN 325 MG PO TABS
650.0000 mg | ORAL_TABLET | Freq: Four times a day (QID) | ORAL | Status: DC | PRN
  Administered 2023-07-01 – 2023-07-07 (×4): 650 mg via ORAL
  Filled 2023-07-01 (×4): qty 2

## 2023-07-01 MED ORDER — HYDRALAZINE HCL 50 MG PO TABS
50.0000 mg | ORAL_TABLET | Freq: Three times a day (TID) | ORAL | Status: DC
  Administered 2023-07-01 – 2023-07-08 (×19): 50 mg via ORAL
  Filled 2023-07-01 (×19): qty 1

## 2023-07-01 MED ORDER — HYDRALAZINE HCL 20 MG/ML IJ SOLN
10.0000 mg | Freq: Four times a day (QID) | INTRAMUSCULAR | Status: DC | PRN
  Administered 2023-07-01: 10 mg via INTRAVENOUS
  Filled 2023-07-01: qty 1

## 2023-07-01 MED ORDER — PANTOPRAZOLE SODIUM 20 MG PO TBEC
20.0000 mg | DELAYED_RELEASE_TABLET | Freq: Every day | ORAL | Status: DC
  Administered 2023-07-01 – 2023-07-08 (×8): 20 mg via ORAL
  Filled 2023-07-01 (×8): qty 1

## 2023-07-01 MED ORDER — FOLIC ACID 1 MG PO TABS
1.0000 mg | ORAL_TABLET | Freq: Every day | ORAL | Status: DC
  Administered 2023-07-01 – 2023-07-08 (×8): 1 mg via ORAL
  Filled 2023-07-01 (×8): qty 1

## 2023-07-01 MED ORDER — FUROSEMIDE 10 MG/ML IJ SOLN
4.0000 mg/h | INTRAVENOUS | Status: DC
  Administered 2023-07-01 – 2023-07-05 (×3): 4 mg/h via INTRAVENOUS
  Filled 2023-07-01 (×3): qty 20

## 2023-07-01 MED ORDER — HYDRALAZINE HCL 50 MG PO TABS
25.0000 mg | ORAL_TABLET | Freq: Three times a day (TID) | ORAL | Status: DC
  Administered 2023-07-01 (×2): 25 mg via ORAL
  Filled 2023-07-01 (×2): qty 1

## 2023-07-01 MED ORDER — ENOXAPARIN SODIUM 40 MG/0.4ML IJ SOSY
40.0000 mg | PREFILLED_SYRINGE | INTRAMUSCULAR | Status: DC
  Administered 2023-07-01: 40 mg via SUBCUTANEOUS
  Filled 2023-07-01: qty 0.4

## 2023-07-01 MED ORDER — ASPIRIN 81 MG PO TBEC
81.0000 mg | DELAYED_RELEASE_TABLET | Freq: Every day | ORAL | Status: DC
  Administered 2023-07-01: 81 mg via ORAL
  Filled 2023-07-01: qty 1

## 2023-07-01 MED ORDER — ONDANSETRON HCL 4 MG PO TABS
4.0000 mg | ORAL_TABLET | Freq: Four times a day (QID) | ORAL | Status: DC | PRN
  Administered 2023-07-02 – 2023-07-03 (×2): 4 mg via ORAL
  Filled 2023-07-01 (×2): qty 1

## 2023-07-01 MED ORDER — ENOXAPARIN SODIUM 60 MG/0.6ML IJ SOSY: 0.5000 mg/kg | PREFILLED_SYRINGE | INTRAMUSCULAR | Status: DC

## 2023-07-01 MED ORDER — ENOXAPARIN SODIUM 40 MG/0.4ML IJ SOSY: 40.0000 mg | PREFILLED_SYRINGE | INTRAMUSCULAR | Status: DC

## 2023-07-01 MED ORDER — GLUCERNA SHAKE PO LIQD
237.0000 mL | Freq: Three times a day (TID) | ORAL | Status: DC
  Administered 2023-07-01 – 2023-07-06 (×12): 237 mL via ORAL

## 2023-07-01 MED ORDER — FUROSEMIDE 10 MG/ML IJ SOLN: 40.0000 mg | Freq: Two times a day (BID) | INTRAMUSCULAR | Status: DC

## 2023-07-01 NOTE — Consult Note (Signed)
[facility] Consult Note: 07/01/2023    Date of Admission:  06/30/2023           Reason for Consult:  AKI, fluid overload   Referring Provider: Gillis Santa, MD Primary Care Provider: Pcp, No   History of Presenting Illness:  Shannon Cox is a 52 y.o. female with medical problems of hypertension, diabetes-insulin-dependent, peripheral neuropathy, diabetic retinopathy, right foot drop, chronic kidney disease, anemia, morbid obesity, recurrent pancreatitis, nephrotic syndrome. She is undergoing evaluation for nephrotic syndrome at [facility] nephrology and was last seen by Dr. Eulah Pont on 03/25/2023. Review of records indicated she has history of intermittent nonsteroidal use.  She has been referred for a kidney biopsy but that has not been able to be completed because of elevated blood pressures. Her outpatient regimen includes atorvastatin, empagliflozin, valsartan. Her diabetes has been poorly controlled in the past. She has albumin to creatinine ratio of almost 6 g. She was recently started on finerenone. Serological workup including ANA, complements, paraproteinemia, PLA2R, HIV, hepatitis serologies were negative.  She presented to the emergency room on 06/30/2023 for worsening lower extremity edema, worsening shortness of breath. She has been placed on IV furosemide infusion. Her lab results indicated an increase in creatinine to 3.33 suggesting AKI.  Nephrology consult has not been requested for further evaluation.  Review of Systems: Review of Systems  Constitutional:  Positive for malaise/fatigue.  HENT: Negative.    Eyes: Negative.   Respiratory:  Positive for shortness of breath.   Cardiovascular:  Positive for leg swelling.  Gastrointestinal: Negative.   Genitourinary: Negative.   Musculoskeletal: Negative.   Skin: Negative.   Neurological: Negative.   Endo/Heme/Allergies: Negative.   Psychiatric/Behavioral: Negative.      Past Medical History:   Diagnosis Date   Hypertension     Social History   Tobacco Use   Smoking status: Never   Smokeless tobacco: Never  Substance Use Topics   Alcohol use: Not Currently   Drug use: Never    History reviewed. No pertinent family history.   OBJECTIVE: Blood pressure (!) 146/72, pulse 84, temperature 98.1 F (36.7 C), temperature source Oral, resp. rate (!) 26, height 5\' 5"  (1.651 m), weight 115.2 kg, last menstrual period 04/05/2023, SpO2 99%.  Physical Exam General Appearance-lying in the bed in the emergency room, eating her lunch. HEENT: Moist oral mucous membranes, no acute distress Neck: Supple, no masses Pulmonary: Normal breathing effort Cardiac: Regular rhythm, no gallops Abdomen: Soft, nontender Extremities: Significant edema over lower legs bilaterally Neuro: Alert, oriented.  Able to answer questions appropriately.  Lab Results Lab Results  Component Value Date   WBC 9.1 07/01/2023   HGB 10.1 (L) 07/01/2023   HCT 32.5 (L) 07/01/2023   MCV 76.3 (L) 07/01/2023   PLT 315 07/01/2023    Lab Results  Component Value Date   CREATININE 3.14 (H) 07/01/2023   BUN 35 (H) 07/01/2023   NA 139 07/01/2023   K 4.1 07/01/2023   CL 107 07/01/2023   CO2 22 07/01/2023    Lab Results  Component Value Date   ALT 10 07/01/2023   AST 11 (L) 07/01/2023   ALKPHOS 76 07/01/2023   BILITOT 0.2 (L) 07/01/2023     Microbiology: No results found for this or any previous visit (from the past 240 hour(s)).  Medications: Scheduled Meds:  [START ON 07/02/2023] enoxaparin (LOVENOX) injection  40 mg Subcutaneous Q24H   feeding supplement (GLUCERNA SHAKE)  237 mL Oral TID BM   folic  acid  1 mg Oral Daily   hydrALAZINE  50 mg Oral Q8H   insulin aspart  0-20 Units Subcutaneous TID WC   insulin aspart  0-5 Units Subcutaneous QHS   insulin glargine-yfgn  20 Units Subcutaneous QHS   metoprolol tartrate  50 mg Oral BID   pantoprazole  20 mg Oral Daily   [START ON 07/02/2023] potassium  chloride  20 mEq Oral Daily   rosuvastatin  20 mg Oral Daily   Continuous Infusions:  furosemide (LASIX) 200 mg in dextrose 5 % 100 mL (2 mg/mL) infusion 4 mg/hr (07/01/23 1118)   PRN Meds:.acetaminophen **OR** acetaminophen, hydrALAZINE, ondansetron **OR** ondansetron (ZOFRAN) IV  Allergies  Allergen Reactions   Insulins     Levimir, skin reaction.    Sulfa Antibiotics Rash    Urinalysis: Recent Labs    07/01/23 0432  COLORURINE YELLOW*  LABSPEC 1.007  PHURINE 5.0  GLUCOSEU 150*  HGBUR SMALL*  BILIRUBINUR NEGATIVE  KETONESUR NEGATIVE  PROTEINUR >=300*  NITRITE NEGATIVE  LEUKOCYTESUR LARGE*      Imaging: DG Chest 2 View  Result Date: 06/30/2023 CLINICAL DATA:  Shortness of breath EXAM: CHEST - 2 VIEW COMPARISON:  Chest x-ray 08/24/2022 FINDINGS: The heart is enlarged. There is no focal lung infiltrate, pleural effusion or pneumothorax. No acute fractures are seen. IMPRESSION: 1. No active cardiopulmonary disease. 2. Cardiomegaly. Electronically Signed   By: Darliss Cheney M.D.   On: 06/30/2023 19:34      Assessment/Plan:  Shannon Cox is a 51 y.o. female with medical problems of hypertension, insulin-dependent diabetes with neuropathy and retinopathy, foot drop, chronic kidney disease, anemia, morbid obesity, recurrent pancreatitis, was admitted on 06/30/2023 for :  Shortness of breath [R06.02] Acute CHF (HCC) [I50.9] AKI (acute kidney injury) (HCC) [N17.9]  Acute kidney injury on chronic kidney disease stage IIIb. Underlying CKD risk factors include poorly controlled diabetes, hypertension, obesity, use of nonsteroidals. Baseline creatinine of 1.85, GFR 33 from 04/14/2023.  Urine protein to creatinine ratio of 13 g. Empagliflozin, valsartan on hold.  Diabetes type 2 with CKD Most recent hemoglobin A1c 7.1%. Current regimen includes insulin Semglee  Nephrotic proteinuria Urine protein to creatinine ratio of 13 g as outpatient. Serologies negative.  Most  likely it is related to diabetes. Patient would like to get her kidney biopsy done while patient is possible.  Hypertension with lower extremity edema Current regimen includes hydralazine, metoprolol. Volume control with IV furosemide infusion. Will monitor urine output and daily weights.  Shannon Cox 07/01/23

## 2023-07-01 NOTE — ED Notes (Signed)
Echo at bedside

## 2023-07-01 NOTE — Assessment & Plan Note (Signed)
Continue basal insulin Hold Jardiance and glipizide Sliding scale insulin coverage

## 2023-07-01 NOTE — Progress Notes (Signed)
Anticoagulation monitoring(Lovenox):  52 yo  female ordered Lovenox 30 mg Q24h    Filed Weights   06/30/23 1800  Weight: 115.2 kg (254 lb)   BMI 42.3    Lab Results  Component Value Date   CREATININE 3.33 (H) 06/30/2023   CREATININE 1.65 (H) 02/14/2023   CREATININE 1.28 (H) 10/06/2022   Estimated Creatinine Clearance: 25.1 mL/min (A) (by C-G formula based on SCr of 3.33 mg/dL (H)). Hemoglobin & Hematocrit     Component Value Date/Time   HGB 9.8 (L) 06/30/2023 1808   HCT 32.3 (L) 06/30/2023 1808     Per Protocol for Patient with estCrcl > 30 ml/min and BMI > 30, will transition to Lovenox 40 mg Q24h.

## 2023-07-01 NOTE — Consult Note (Signed)
Cardiology Consultation   Patient ID: Shannon Cox MRN: [id number]; DOB: 1971-08-08  Admit date: 06/30/2023 Date of Consult: 07/01/2023  PCP:  Oneita Hurt, No   [facility] Providers Cardiologist:  New   Patient Profile:   Shannon Cox is a 52 y.o. female with a hx of hypertension, diabetes type 2 on insulin, peripheral neuropathy with right foot drop with ambulatory dysfunction, CKD stage III with anemia of CKD, morbid obesity, recurrent pancreatitis who is being seen 07/01/2023 for the evaluation of acute heart failure at the request of Dr. Lucianne Muss.  History of Present Illness:   Ms. Shannon Cox has not been seen by cardiology in the past.  The patient used to smoke. She quit in 2015. No alcohol or drug history. She has a family history of CAD. She reports family history of CAD.  She was hospitalized in June 2024 at [facility] with generalized abdominal pain, with acute cystitis with hematuria.  BNP 168.  Echo showed normal pump function with small pericardial effusion.  Patient was discharged on Lasix for lower leg edema.  Patient presented to the ER at [facility] 07/01/2023 with shortness of breath. The patient reports progressive SOB for the last few months. Breathing is worse when she lays flat. Also had coughing on lisinopril. coughing caused chest pain. It hurts if she presses on the chest. She reports lower extremity edema up into the hip. She denies recent fever or chills. Says she was taking lasix without improvement.   In the ER blood pressure 173/81, pulse rate 92, respiratory rate 20, afebrile, 94% O2.  Labs showed potassium 3.4, CO2 21, blood glucose 116, serum creatinine 3.33, hemoglobin 9.8.  BNP 250.  High-sensitivity troponin 22, 27.  Chest x-ray was nonacute.  EKG showed normal sinus rhythm, 95 bpm, nonspecific ST-T wave changes.  Patient was given IV Lasix, oral hydralazine and admitted for further workup.   Past Medical History:  Diagnosis Date   Hypertension      History reviewed. No pertinent surgical history.   Home Medications:  Prior to Admission medications   Medication Sig Start Date End Date Taking? Authorizing Provider  amLODipine (NORVASC) 10 MG tablet Take 1 tablet (10 mg total) by mouth daily. 08/25/22 08/25/23 Yes Irean Hong, MD  cetirizine (ZYRTEC) 10 MG tablet Take 10 mg by mouth daily. 05/21/21  Yes [provider]  empagliflozin (JARDIANCE) 25 MG TABS tablet Take 1 tablet by mouth daily. 02/09/22  Yes [provider]  fluticasone (FLONASE) 50 MCG/ACT nasal spray Place 2 sprays into both nostrils daily. 06/02/22 07/01/23 Yes Shaune Pollack, MD  furosemide (LASIX) 20 MG tablet Take 1 tablet (20 mg total) by mouth daily for 4 days. 02/14/23 07/01/23 Yes Phineas Semen, MD  hydrALAZINE (APRESOLINE) 25 MG tablet Take 1 tablet by mouth 2 (two) times daily with a meal. 03/23/23  Yes [provider]  meclizine (ANTIVERT) 25 MG tablet Take 1 tablet (25 mg total) by mouth 3 (three) times daily as needed for dizziness. 06/02/22  Yes Shaune Pollack, MD  ondansetron (ZOFRAN-ODT) 4 MG disintegrating tablet Allow 1-2 tablets to dissolve in your mouth every 8 hours as needed for nausea/vomiting 07/25/22  Yes Loleta Rose, MD  pantoprazole (PROTONIX) 20 MG tablet Take 1 tablet (20 mg total) by mouth daily. 10/06/22 10/06/23 Yes Phineas Semen, MD  potassium chloride SA (KLOR-CON M) 20 MEQ tablet Take 1 tablet (20 mEq total) by mouth daily for 5 days. 10/06/22 07/01/23 Yes Phineas Semen, MD  [facility]  200 UNIT/ML FlexTouch Pen Inject 25 Units into the skin daily. 01/22/23  Yes [provider]  valsartan (DIOVAN) 320 MG tablet Take 320 mg by mouth daily. 12/29/22  Yes [provider]  amLODipine (NORVASC) 5 MG tablet Take 1 tablet (5 mg total) by mouth daily. 07/25/22 10/23/22  Loleta Rose, MD  Cholecalciferol 1.25 MG (50000 UT) capsule Take 50,000 Units by mouth once a week.    [provider]   diclofenac Sodium (VOLTAREN) 1 % GEL Apply 2 g topically 4 (four) times daily.    [provider]  glipiZIDE (GLUCOTROL XL) 5 MG 24 hr tablet Take 1 tablet by mouth 2 (two) times daily.    [provider]  HYDROcodone-acetaminophen (NORCO) 5-325 MG tablet Take 1 tablet by mouth every 6 (six) hours as needed for moderate pain. Patient not taking: Reported on 07/01/2023 08/25/22   Irean Hong, MD  lidocaine (XYLOCAINE) 2 % solution Use as directed 15 mLs in the mouth or throat as needed for mouth pain. Patient not taking: Reported on 07/01/2023 10/06/22   Phineas Semen, MD  oxyCODONE-acetaminophen (PERCOCET) 5-325 MG tablet Take 2 tablets by mouth every 6 (six) hours as needed for severe pain. Patient not taking: Reported on 07/01/2023 07/25/22   Loleta Rose, MD    Inpatient Medications: Scheduled Meds:  enoxaparin (LOVENOX) injection  40 mg Subcutaneous Q24H   feeding supplement (GLUCERNA SHAKE)  237 mL Oral TID BM   furosemide  20 mg Intravenous BID   hydrALAZINE  25 mg Oral Q8H   insulin aspart  0-20 Units Subcutaneous TID WC   insulin aspart  0-5 Units Subcutaneous QHS   insulin glargine-yfgn  20 Units Subcutaneous QHS   pantoprazole  20 mg Oral Daily   potassium chloride  40 mEq Oral Once   Continuous Infusions:  PRN Meds: acetaminophen **OR** acetaminophen, ondansetron **OR** ondansetron (ZOFRAN) IV  Allergies:    Allergies  Allergen Reactions   Insulins     Levimir, skin reaction.    Sulfa Antibiotics Rash    Social History:   Social History   Socioeconomic History   Marital status: Divorced    Spouse name: Not on file   Number of children: Not on file   Years of education: Not on file   Highest education level: Not on file  Occupational History   Not on file  Tobacco Use   Smoking status: Never   Smokeless tobacco: Never  Substance and Sexual Activity   Alcohol use: Not Currently   Drug use: Never   Sexual activity: Not on file  Other  Topics Concern   Not on file  Social History Narrative   Not on file   Social Determinants of Health   Financial Resource Strain: Low Risk  (03/18/2021)   Received from [facility], [facility]   Overall Financial Resource Strain (CARDIA)    Difficulty of Paying Living Expenses: Not very hard  Food Insecurity: No Food Insecurity (03/18/2021)   Received from [facility], [facility]   Hunger Vital Sign    Worried About Running Out of Food in the Last Year: Never true    Ran Out of Food in the Last Year: Never true  Transportation Needs: No Transportation Needs (03/18/2023)   Received from [facility] - Transportation    In the past 12 months, has lack of transportation kept you from medical appointments or from getting  medications?: No    Lack of Transportation (Non-Medical): No  Physical Activity: Inactive (03/18/2021)   Received from [facility], [facility]   Exercise Vital Sign    Days of Exercise per Week: 0 days    Minutes of Exercise per Session: 0 min  Stress: Stress Concern Present (03/18/2021)   Received from [facility], [facility]   [employer] of Occupational Health - Occupational Stress Questionnaire    Feeling of Stress : Rather much  Social Connections: Unknown (03/18/2021)   Received from [facility], [facility]   Social Connection and Isolation Panel [NHANES]    Frequency of Communication with Friends and Family: More than three times a week    Frequency of Social Gatherings with Friends and Family: More than three times a week    Attends Religious Services: Never    Database administrator or Organizations: No    Attends Banker Meetings: Never    Marital Status: Patient declined  Catering manager Violence: Not on file    Family History:    History reviewed. No pertinent family history.   ROS:  Please see the history of present illness.   All other ROS reviewed and negative.     Physical Exam/Data:   Vitals:   07/01/23 0530 07/01/23 0549 07/01/23 0600 07/01/23 0725  BP: (!) 167/111 (!) 167/111 (!) 182/90 (!) 146/90  Pulse: 100  98 96  Resp: 20  18 19   Temp:    98.2 F (36.8 C)  TempSrc:    Oral  SpO2: 96%  97% 96%  Weight:      Height:        Intake/Output Summary (Last 24 hours) at 07/01/2023 0743 Last data filed at 07/01/2023 0440 Gross per 24 hour  Intake --  Output 1400 ml  Net -1400 ml      06/30/2023    6:00 PM 02/14/2023    8:44 PM 10/06/2022   12:52 PM  Last 3 Weights  Weight (lbs) 254 lb 246 lb 265 lb 14 oz  Weight (kg) 115.214 kg 111.585 kg 120.6 kg     Body mass index is 42.27 kg/m.  General:  Well nourished, well developed, in no acute distress HEENT: normal Neck: no JVD Vascular: No carotid bruits; Distal pulses 2+ bilaterally Cardiac:  normal S1, S2; RRR; no murmur  Lungs:  clear to auscultation bilaterally, no wheezing, rhonchi or rales  Abd: soft, nontender, no hepatomegaly  Ext: 3+ lower extremity edema Musculoskeletal:  No deformities, BUE and BLE strength normal and equal Skin: warm and dry  Neuro:  CNs 2-12 intact, no focal abnormalities noted Psych:  Normal affect   EKG:  The EKG was personally reviewed and demonstrates:  NSR 95bpm, nonspecific ST/T wave changes Telemetry:  Telemetry was personally reviewed and demonstrates:  NSR 90s  Relevant CV Studies:  Echo ordered  Echo 03/2023 NORMAL LEFT VENTRICULAR SYSTOLIC FUNCTION   WITH MODERATE LVH  NORMAL RIGHT VENTRICULAR SYSTOLIC FUNCTION  NO VALVULAR STENOSIS  MILD MITRAL AND TRICUSPID REGURGITAION  DILATED IVC  SMALL PEARDIAL EFFUSION WTIHOUT EVIDENCE OF HEMODYNAMIC COMPROMISE   Laboratory Data:  High Sensitivity Troponin:   Recent Labs  Lab 06/30/23 1808 06/30/23 2228  TROPONINIHS 22* 27*     Chemistry Recent  Labs  Lab 06/30/23 1808 07/01/23 0432  NA 138 138  K 3.4* 3.0*  CL 108 109  CO2 21* 23  GLUCOSE 116*  127*  BUN 31* 33*  CREATININE 3.33* 3.23*  CALCIUM 8.3* 8.3*  GFRNONAA 16* 17*  ANIONGAP 9 6    Recent Labs  Lab 06/30/23 2228 07/01/23 0432  PROT 6.4* 6.2*  ALBUMIN 2.2* 2.1*  AST 13* 11*  ALT 11 10  ALKPHOS 75 76  BILITOT 0.6 0.2*   Lipids No results for input(s): "CHOL", "TRIG", "HDL", "LABVLDL", "LDLCALC", "CHOLHDL" in the last 168 hours.  Hematology Recent Labs  Lab 06/30/23 1808 07/01/23 0432  WBC 7.2 9.1  RBC 4.17 4.26  HGB 9.8* 10.1*  HCT 32.3* 32.5*  MCV 77.5* 76.3*  MCH 23.5* 23.7*  MCHC 30.3 31.1  RDW 20.6* 20.4*  PLT 331 315   Thyroid No results for input(s): "TSH", "FREET4" in the last 168 hours.  BNP Recent Labs  Lab 06/30/23 1808  BNP 250.6*    DDimer No results for input(s): "DDIMER" in the last 168 hours.   Radiology/Studies:  DG Chest 2 View  Result Date: 06/30/2023 CLINICAL DATA:  Shortness of breath EXAM: CHEST - 2 VIEW COMPARISON:  Chest x-ray 08/24/2022 FINDINGS: The heart is enlarged. There is no focal lung infiltrate, pleural effusion or pneumothorax. No acute fractures are seen. IMPRESSION: 1. No active cardiopulmonary disease. 2. Cardiomegaly. Electronically Signed   By: Darliss Cheney M.D.   On: 06/30/2023 19:34     Assessment and Plan:   Acute diastolic heart failure - presented with progressive SOB and orthopnea for the last few months - BNP 250 and CXR non-acute started on IV lasix - echo in 03/2023 showed normal LVEF, moderate LVH, mild MR and TR, dilated IVC, small pericardial effusion without tamponade - PTA lasix without improvement - IV lasix 20mg  BID - scr above baseline - Net -1.4L - increase lasix IV to 40mg  BID - monitor daily weights, kidney function and strict I/Os  Elevated troponin - HS troponin 22>27 - IV heparin x 48 hours - continue to trend troponin - suspect mostly demand ischemia, but cannot rule  out CAD - can start ASA and statin - may need stress test eventually. Not a good candidate for Cath/cardiac CTA given AKI/CKD.  HTN - intermittently elevated - Hydralazine 25mg  TID - PTA valsartan and amlodipine held - may need to increase Hydralazine  DM2 - A1C pending  AKI on CKD stage 3 - scr 3.33, BUN 31 - Scr was 1.28 eight months ago - PTA Valsartan held - trend with diuresis  Hypokalemia - supplement as needed  HLD - 132 in 03/2022 - start statin  For questions or updates, please contact [facility] Please consult [email or web address] for contact info under    Signed, Londyn Wotton David Stall, PA-C  07/01/2023 7:43 AM

## 2023-07-01 NOTE — ED Notes (Signed)
Pt assisted back into bed from bedside commode. Monitoring equipment in place. Side rails raised and call bell in reach. Pt noted to be alert and oriented, breathing unlabored and speaking in full sentences. Bed low and locked.   Pail in bedside commode fell onto floor. Area cleaned as well as commode. New pail and hat placed. Pt also requested for slippers to be removed and hospital socks applied. Request met. Pt requests walker so that she can walk to bathroom in room the next time she needs to void.

## 2023-07-01 NOTE — ED Notes (Signed)
Hospitalist messaged to order pt home blood pressure medications

## 2023-07-01 NOTE — H&P (Signed)
History and Physical    Patient: Shannon Cox XBM:[id number] DOB: 10/15/1970 DOA: 06/30/2023 DOS: the patient was seen and examined on 07/01/2023 PCP: Pcp, No  Patient coming from: Home  Chief Complaint:  Chief Complaint  Patient presents with   Shortness of Breath    HPI: Shannon Cox is a 52 y.o. 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, ambulant with cane, CKD 3a with anemia of CKD, morbid obesity, recurrent pancreatitis, hospitalized  at [facility] in June 2024 for nausea vomiting and diarrhea, discharged to outpatient GI for upper and lower endoscopy, and who during that admission was found to have lower extremity edema, placed on Lasix and discharged with outpatient follow-up to nephrology to evaluate for nephrotic syndrome, who presents to the ED with a complaint of shortness of breath that acutely worsened on the evening of presentation while lying flat, improved somewhat by sitting upright.  She had an upper endoscopy the day prior at [facility] that showed gastritis, biopsies pending.  She reports that her lower extremity edema has been worsening for the past week.  She denies chest pain, cough, fever or chills.  Of note during her hospitalization back in June, she had an echocardiogram that showed normal EF but did show a small pericardial effusion without hemodynamic significance.she was also recommended for sleep study due to patient-reported episodic abnormal breathing patterns at night. ED course and data review: BP in the ED in the 170s to 180s with pulse in the 90s and otherwise normal vitals.  O2 sats in the high 90s on room air. Labs: Notable for troponin 27 and BNP 250.  WBC normal and hemoglobin 9.6.  Potassium 3.4.  Creatinine 3.33 up from baseline of 1.6.  EKG, personally viewed and interpreted showing NSR at 95 with no acute ST-T wave changes. Chest x-ray shows cardiomegaly with  no active cardiopulmonary disease Patient treated with IV Lasix 40 mg and hospitalist consulted for admission.   Review of Systems: As mentioned in the history of present illness. All other systems reviewed and are negative.  Past Medical History:  Diagnosis Date   Hypertension    History reviewed. No pertinent surgical history. Social History:  reports that she has never smoked. She has never used smokeless tobacco. She reports that she does not currently use alcohol. She reports that she does not use drugs.  Allergies  Allergen Reactions   Insulins     Levimir, skin reaction.    Sulfa Antibiotics Rash    History reviewed. No pertinent family history.  Prior to Admission medications   Medication Sig Start Date End Date Taking? Authorizing Provider  amLODipine (NORVASC) 10 MG tablet Take 1 tablet (10 mg total) by mouth daily. 08/25/22 08/25/23  Irean Hong, MD  amLODipine (NORVASC) 5 MG tablet Take 1 tablet (5 mg total) by mouth daily. 07/25/22 10/23/22  Loleta Rose, MD  fluticasone (FLONASE) 50 MCG/ACT nasal spray Place 2 sprays into both nostrils daily. 06/02/22 07/02/22  Shaune Pollack, MD  furosemide (LASIX) 20 MG tablet Take 1 tablet (20 mg total) by mouth daily for 4 days. 02/14/23 02/18/23  Phineas Semen, MD  HYDROcodone-acetaminophen (NORCO) 5-325 MG tablet Take 1 tablet by mouth every 6 (six) hours as needed for moderate pain. 08/25/22   Irean Hong, MD  lidocaine (XYLOCAINE) 2 % solution Use as directed 15 mLs in the mouth or throat as needed for mouth pain. 10/06/22   Derrill Kay,  Dustin Flock, MD  meclizine (ANTIVERT) 25 MG tablet Take 1 tablet (25 mg total) by mouth 3 (three) times daily as needed for dizziness. 06/02/22   Shaune Pollack, MD  ondansetron (ZOFRAN-ODT) 4 MG disintegrating tablet Allow 1-2 tablets to dissolve in your mouth every 8 hours as needed for nausea/vomiting 07/25/22   Loleta Rose, MD  oxyCODONE-acetaminophen (PERCOCET) 5-325 MG tablet Take 2 tablets by  mouth every 6 (six) hours as needed for severe pain. 07/25/22   Loleta Rose, MD  pantoprazole (PROTONIX) 20 MG tablet Take 1 tablet (20 mg total) by mouth daily. 10/06/22 10/06/23  Phineas Semen, MD  potassium chloride SA (KLOR-CON M) 20 MEQ tablet Take 1 tablet (20 mEq total) by mouth daily for 5 days. 10/06/22 10/11/22  Phineas Semen, MD    Physical Exam: Vitals:   06/30/23 2227 06/30/23 2230 06/30/23 2300 06/30/23 2330  BP: (!) 189/86 (!) 169/62 (!) 185/64 (!) 176/83  Pulse:  91 92 94  Resp:   20 20  Temp:   98.4 F (36.9 C)   TempSrc:      SpO2:  97% 97% 98%  Weight:      Height:       Physical Exam Vitals and nursing note reviewed.  Constitutional:      General: She is not in acute distress.    Comments: Conversational dyspnea  HENT:     Head: Normocephalic and atraumatic.  Cardiovascular:     Rate and Rhythm: Normal rate and regular rhythm.     Heart sounds: Normal heart sounds.  Pulmonary:     Effort: Tachypnea present.     Breath sounds: Normal breath sounds.  Abdominal:     Palpations: Abdomen is soft.     Tenderness: There is no abdominal tenderness.  Musculoskeletal:     Right lower leg: 3+ Edema present.     Left lower leg: 3+ Edema present.  Neurological:     Mental Status: Mental status is at baseline.     Labs on Admission: I have personally reviewed following labs and imaging studies  CBC: Recent Labs  Lab 06/30/23 1808  WBC 7.2  HGB 9.8*  HCT 32.3*  MCV 77.5*  PLT 331   Basic Metabolic Panel: Recent Labs  Lab 06/30/23 1808  NA 138  K 3.4*  CL 108  CO2 21*  GLUCOSE 116*  BUN 31*  CREATININE 3.33*  CALCIUM 8.3*   GFR: Estimated Creatinine Clearance: 25.1 mL/min (A) (by C-G formula based on SCr of 3.33 mg/dL (H)). Liver Function Tests: No results for input(s): "AST", "ALT", "ALKPHOS", "BILITOT", "PROT", "ALBUMIN" in the last 168 hours. No results for input(s): "LIPASE", "AMYLASE" in the last 168 hours. No results for input(s):  "AMMONIA" in the last 168 hours. Coagulation Profile: No results for input(s): "INR", "PROTIME" in the last 168 hours. Cardiac Enzymes: No results for input(s): "CKTOTAL", "CKMB", "CKMBINDEX", "TROPONINI" in the last 168 hours. BNP (last 3 results) No results for input(s): "PROBNP" in the last 8760 hours. HbA1C: No results for input(s): "HGBA1C" in the last 72 hours. CBG: No results for input(s): "GLUCAP" in the last 168 hours. Lipid Profile: No results for input(s): "CHOL", "HDL", "LDLCALC", "TRIG", "CHOLHDL", "LDLDIRECT" in the last 72 hours. Thyroid Function Tests: No results for input(s): "TSH", "T4TOTAL", "FREET4", "T3FREE", "THYROIDAB" in the last 72 hours. Anemia Panel: No results for input(s): "VITAMINB12", "FOLATE", "FERRITIN", "TIBC", "IRON", "RETICCTPCT" in the last 72 hours. Urine analysis:    Component Value Date/Time   COLORURINE YELLOW (A)  10/06/2022 1220   APPEARANCEUR HAZY (A) 10/06/2022 1220   APPEARANCEUR Cloudy (A) 09/16/2022 0900   LABSPEC 1.011 10/06/2022 1220   PHURINE 7.0 10/06/2022 1220   GLUCOSEU >=500 (A) 10/06/2022 1220   HGBUR NEGATIVE 10/06/2022 1220   BILIRUBINUR NEGATIVE 10/06/2022 1220   BILIRUBINUR Negative 09/16/2022 0900   KETONESUR NEGATIVE 10/06/2022 1220   PROTEINUR >=300 (A) 10/06/2022 1220   NITRITE NEGATIVE 10/06/2022 1220   LEUKOCYTESUR NEGATIVE 10/06/2022 1220    Radiological Exams on Admission: DG Chest 2 View  Result Date: 06/30/2023 CLINICAL DATA:  Shortness of breath EXAM: CHEST - 2 VIEW COMPARISON:  Chest x-ray 08/24/2022 FINDINGS: The heart is enlarged. There is no focal lung infiltrate, pleural effusion or pneumothorax. No acute fractures are seen. IMPRESSION: 1. No active cardiopulmonary disease. 2. Cardiomegaly. Electronically Signed   By: Darliss Cheney M.D.   On: 06/30/2023 19:34     Data Reviewed: Relevant notes from primary care and specialist visits, past discharge summaries as available in EHR, including Care  Everywhere. Prior diagnostic testing as pertinent to current admission diagnoses Updated medications and problem lists for reconciliation ED course, including vitals, labs, imaging, treatment and response to treatment Triage notes, nursing and pharmacy notes and ED provider's notes Notable results as noted in HPI   Assessment and Plan: * Acute heart failure (HCC) Elevated troponin, suspect demand ischemia History of small pericardial effusion June 2024 Hypertensive urgency/emergency Echo 03/18/2023 showed EF>55% and small pericardial effusion without hemodynamic compromise Given cardiomegaly on chest x-ray will repeat echo to evaluate size of pericardial effusion IV Lasix, oral hydralazine for blood pressure control.  Hold ACE/ARB's for now.  Consider beta-blockers prior to discharge Hold amlodipine for possible contribution to lower extremity edema Continue to trend troponin Cardiology consulted   Acute renal failure superimposed on stage 3b chronic kidney disease (HCC) Anemia of CKD 3b Patient with acute worsening of CKD with creatinine 3.3 up from baseline of 1.6.  Hemoglobin at baseline Possibly medication related Hold Lasix 20 and valsartan 320 and will hold Jardiance as well Monitor for worsening in view of IV Lasix in the management of acute CHF Patient had an outpatient  nephrology referral for workup for nephrotic syndrome(had SPEP UPEP done while at [facility]) Nephrology consulted Will get urinalysis, urine protein  OSA (obstructive sleep apnea) Suspected OSA Patient with recently did a home sleep study and is awaiting the results  Diabetic neuropathy (HCC) Multimodal pain control.  Avoid NSAIDs  Obesity, Class III, BMI 40-49.9 (morbid obesity) (HCC) Complicating factor to overall prognosis and care  Insulin dependent type 2 diabetes mellitus (HCC) Continue basal insulin Hold Jardiance and glipizide Sliding scale insulin coverage        DVT prophylaxis:  Lovenox  Consults: Cardiology and nephrology  Advance Care Planning:   Code Status: Not on file   Family Communication: none  Disposition Plan: Back to previous home environment  Severity of Illness: The appropriate patient status for this patient is INPATIENT. Inpatient status is judged to be reasonable and necessary in order to provide the required intensity of service to ensure the patient's safety. The patient's presenting symptoms, physical exam findings, and initial radiographic and laboratory data in the context of their chronic comorbidities is felt to place them at high risk for further clinical deterioration. Furthermore, it is not anticipated that the patient will be medically stable for discharge from the hospital within 2 midnights of admission.   * I certify that at the point of admission it is my  clinical judgment that the patient will require inpatient hospital care spanning beyond 2 midnights from the point of admission due to high intensity of service, high risk for further deterioration and high frequency of surveillance required.*  Author: Andris Baumann, MD 07/01/2023 12:11 AM  For on call review [email or web address].

## 2023-07-01 NOTE — ED Notes (Signed)
Pt eating lunch, visitor at bedside. Pt boosted in bed

## 2023-07-01 NOTE — Consult Note (Deleted)
PHARMACIST - PHYSICIAN COMMUNICATION  CONCERNING:  Enoxaparin (Lovenox) for DVT Prophylaxis    RECOMMENDATION: Patient was prescribed enoxaprin 40mg  q24 hours for VTE prophylaxis.   Filed Weights   06/30/23 1800  Weight: 115.2 kg (254 lb)    Body mass index is 42.27 kg/m.  Estimated Creatinine Clearance: 25.8 mL/min (A) (by C-G formula based on SCr of 3.23 mg/dL (H)).   Based on [facility] policy patient is candidate for enoxaparin 0.5mg /kg TBW SQ every 24 hours based on BMI being >30.  DESCRIPTION: Pharmacy has adjusted enoxaparin dose per [facility] policy.  Patient is now receiving enoxaparin 57.5 mg every 24 hours   Celene Squibb, PharmD Clinical Pharmacist 07/01/2023 11:39 AM

## 2023-07-01 NOTE — Assessment & Plan Note (Signed)
Multimodal pain control.  Avoid NSAIDs

## 2023-07-01 NOTE — TOC Initial Note (Signed)
Transition of Care [facility]) - Initial/Assessment Note    Patient Details  Name: Shannon Cox MRN: [id number] Date of Birth: 11/21/1970  [facility] of [facility]) CM/SW Contact:    Marquita Palms, LCSW Phone Number: 07/01/2023, 1:34 PM  Clinical Narrative:                  CSW met with patient at bedside. Pt reports she has family that will pick her up when she is discharged. She has a PCP and [employer] for her insurance. Patient does not have any other needs at this time.       Patient Goals and CMS Choice            Expected Discharge Plan and Services                                              Prior Living Arrangements/Services                       Activities of Daily Living      Permission Sought/Granted                  Emotional Assessment              Admission diagnosis:  Acute CHF [facility]) [I50.9] Patient Active Problem List   Diagnosis Date Noted   Acute CHF (HCC) 07/01/2023   Diabetic neuropathy (HCC) 07/01/2023   OSA (obstructive sleep apnea) 07/01/2023   Shortness of breath 07/01/2023   Obesity, Class III, BMI 40-49.9 (morbid obesity) (HCC) 06/30/2023   Acute heart failure (HCC) 06/30/2023   Elevated troponin 06/30/2023   Acute renal failure superimposed on stage 3b chronic kidney disease (HCC) 06/30/2023   Anemia due to stage 3b chronic kidney disease (HCC) 03/25/2023   Hypertension, essential 03/18/2023   Pericardial effusion without cardiac tamponade 03/18/2023   Stage 3a chronic kidney disease (HCC) 03/18/2023   Moderate nonproliferative diabetic retinopathy (HCC) 08/20/2016   Menorrhagia 07/25/2015   Insulin dependent type 2 diabetes mellitus (HCC) 04/10/2014   PCP:  Oneita Hurt, No Pharmacy:   [id number] [place], [place] - [place] AT [facility] OF [address] [place] [address] Phone: [phone] Fax: [phone]     Social Determinants of  Health (SDOH) Social History: SDOH Screenings   Food Insecurity: No Food Insecurity (03/18/2021)   Received from [facility], [facility]  Transportation Needs: No Transportation Needs (03/18/2023)   Received from [facility]  Financial Resource Strain: Low Risk  (03/18/2021)   Received from [facility], [facility]  Physical Activity: Inactive (03/18/2021)   Received from [facility], [facility]  Social Connections: Unknown (03/18/2021)   Received from [facility], [facility]  Stress: Stress Concern Present (03/18/2021)   Received from [facility], [facility]  Tobacco Use: Low Risk  (06/30/2023)  Recent Concern: Tobacco Use - Medium Risk (06/29/2023)   Received from [facility]   SDOH Interventions:     Readmission Risk Interventions     No data to display

## 2023-07-01 NOTE — Assessment & Plan Note (Addendum)
Anemia of CKD 3b Patient with acute worsening of CKD with creatinine 3.3 up from baseline of 1.6.  Hemoglobin at baseline Possibly medication related Hold Lasix 20 and valsartan 320 and will hold Jardiance as well Monitor for worsening in view of IV Lasix in the management of acute CHF Patient had an outpatient  nephrology referral for workup for nephrotic syndrome(had SPEP UPEP done while at [facility]) Nephrology consulted Will get urinalysis, urine protein

## 2023-07-01 NOTE — Progress Notes (Signed)
[facility] Progress Note  Patient: Shannon Cox    GYI:[id number]  DOA: 06/30/2023     Date of Service: the patient was seen and examined on 07/01/2023  Chief Complaint  Patient presents with   Shortness of Breath   Brief hospital course: Shannon Cox is a 52 y.o. 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, ambulant with cane, CKD 3a with anemia of CKD, morbid obesity, recurrent pancreatitis, hospitalized  at [facility] in June 2024 for nausea vomiting and diarrhea, discharged to outpatient GI for upper and lower endoscopy, and who during that admission was found to have lower extremity edema, placed on Lasix and discharged with outpatient follow-up to nephrology to evaluate for nephrotic syndrome, who presents to the ED with a complaint of shortness of breath that acutely worsened on the evening of presentation while lying flat, improved somewhat by sitting upright.  She had an upper endoscopy the day prior at [facility] that showed gastritis, biopsies pending.  She reports that her lower extremity edema has been worsening for the past week.   ED course and data review: BP in the ED in the 170s to 180s with pulse in the 90s and otherwise normal vitals.  O2 sats in the high 90s on room air. Labs: Notable for troponin 27 and BNP 250.  WBC normal and hemoglobin 9.6.  Potassium 3.4.  Creatinine 3.33 up from baseline of 1.6.  EKG, personally viewed and interpreted showing NSR at 95 with no acute ST-T wave changes. Chest x-ray shows cardiomegaly with no active cardiopulmonary disease Patient treated with IV Lasix 40 mg and hospitalist consulted for admission.   Assessment and Plan: Acute heart failure (HCC) Elevated troponin, suspect demand ischemia History of small pericardial effusion June 2024 Hypertensive urgency/emergency Echo 03/18/2023 showed EF>55% and small pericardial effusion without  hemodynamic compromise Given cardiomegaly on chest x-ray will repeat echo to evaluate size of pericardial effusion S/p IV Lasix,  Continue oral hydralazine for blood pressure control.   Hold ACE/ARB's for now.   Hold amlodipine for possible contribution to lower extremity edema Continue to trend troponin Cardiology consulted 9/26 started Lasix IV infusion   Acute renal failure superimposed on stage 3b chronic kidney disease (HCC) Anemia of CKD 3b Patient with acute worsening of CKD with creatinine 3.3 up from baseline of 1.6.  Hemoglobin at baseline Possibly medication related Hold Lasix 20 and valsartan 320 and will hold Jardiance as well Monitor for worsening in view of IV Lasix in the management of acute CHF Patient had an outpatient  nephrology referral for workup for nephrotic syndrome(had SPEP UPEP done while at [facility]) Nephrology consulted Will get urinalysis, urine protein Nephrology consulted   Hypokalemia, potassium repleted cautiously due to CKD 3.  Hypertensive urgency, uncontrolled blood pressure Increase hydralazine 50 mg p.o. 3 times daily Added metoprolol 50 mg p.o. twice daily Continue Lasix IV infusion for diuresis Monitor BP and titrate medications accordingly    OSA (obstructive sleep apnea) Suspected OSA Patient with recently did a home sleep study and is awaiting the results   Diabetic neuropathy (HCC) Multimodal pain control.  Avoid NSAIDs   Obesity, Class III, BMI 40-49.9 (morbid obesity) (HCC) Complicating factor to overall prognosis and care   Insulin dependent type 2 diabetes mellitus (HCC) Continue basal insulin Hold Jardiance and glipizide Sliding scale insulin coverage   Anemia Iron level within normal range Folic acid deficiency, folic acid 4.6, started on supplement.  Follow-up with PCP to repeat folic acid level after 3 to 6 months.    Body mass index is 42.27 kg/m.  Interventions:  Diet: Carb modified/heart healthy diet with  fluid striction 1.5 L/day DVT Prophylaxis: Subcutaneous Lovenox   Advance goals of care discussion: Full code  Family Communication: family was not present at bedside, at the time of interview.  The pt provided permission to discuss medical plan with the family. Opportunity was given to ask question and all questions were answered satisfactorily.   Disposition:  Pt is from Home, admitted with CHF, and  AKI, still on IV Lasix infusion, which precludes a safe discharge. Discharge to Home with HH TBD after PT and OT eval, when stable, may need few days to stay in the hospital..  Subjective: No significant events overnight, patient was admitted due to shortness of breath and lower EXTR edema, feels some improvement.  Edema in the lower extremities still significant, denied any chest pain or palpitation, no nasal complaints.  Physical Exam: General: NAD, lying comfortably Appear in no distress, affect appropriate Eyes: PERRLA ENT: Oral Mucosa Clear, moist  Neck: no JVD,  Cardiovascular: S1 and S2 Present, no Murmur,  Respiratory: good respiratory effort, Bilateral Air entry equal and Decreased, no Crackles, no wheezes Abdomen: Bowel Sound present, Soft and no tenderness,  Skin: no rashes Extremities: 4+ pedal edema, no calf tenderness, chronic right foot drop Neurologic: without any new focal findings Gait not checked due to patient safety concerns  Vitals:   07/01/23 1300 07/01/23 1305 07/01/23 1333 07/01/23 1442  BP: (!) 180/78 (!) 180/78 (!) 165/70 (!) 146/72  Pulse: 89  95 84  Resp:      Temp:    98.1 F (36.7 C)  TempSrc:    Oral  SpO2: 93%  96% 99%  Weight:      Height:        Intake/Output Summary (Last 24 hours) at 07/01/2023 1455 Last data filed at 07/01/2023 0440 Gross per 24 hour  Intake --  Output 1400 ml  Net -1400 ml   Filed Weights   06/30/23 1800  Weight: 115.2 kg    Data Reviewed: I have personally reviewed and interpreted daily labs, tele strips,  imagings as discussed above. I reviewed all nursing notes, pharmacy notes, vitals, pertinent old records I have discussed plan of care as described above with RN and patient/family.  CBC: Recent Labs  Lab 06/30/23 1808 07/01/23 0432  WBC 7.2 9.1  HGB 9.8* 10.1*  HCT 32.3* 32.5*  MCV 77.5* 76.3*  PLT 331 315   Basic Metabolic Panel: Recent Labs  Lab 06/30/23 1808 07/01/23 0432  NA 138 138  K 3.4* 3.0*  CL 108 109  CO2 21* 23  GLUCOSE 116* 127*  BUN 31* 33*  CREATININE 3.33* 3.23*  CALCIUM 8.3* 8.3*  MG  --  1.8  PHOS  --  4.2    Studies: DG Chest 2 View  Result Date: 06/30/2023 CLINICAL DATA:  Shortness of breath EXAM: CHEST - 2 VIEW COMPARISON:  Chest x-ray 08/24/2022 FINDINGS: The heart is enlarged. There is no focal lung infiltrate, pleural effusion or pneumothorax. No acute fractures are seen. IMPRESSION: 1. No active cardiopulmonary disease. 2. Cardiomegaly. Electronically Signed   By: Darliss Cheney M.D.   On: 06/30/2023 19:34    Scheduled Meds:  [START ON 07/02/2023] enoxaparin (LOVENOX) injection  40 mg Subcutaneous Q24H   feeding supplement (GLUCERNA SHAKE)  237 mL Oral TID BM   folic acid  1 mg Oral Daily   hydrALAZINE  50 mg Oral Q8H   insulin aspart  0-20 Units Subcutaneous TID WC   insulin aspart  0-5 Units Subcutaneous QHS   insulin glargine-yfgn  20 Units Subcutaneous QHS   metoprolol tartrate  50 mg Oral BID   pantoprazole  20 mg Oral Daily   [START ON 07/02/2023] potassium chloride  20 mEq Oral Daily   rosuvastatin  20 mg Oral Daily   Continuous Infusions:  furosemide (LASIX) 200 mg in dextrose 5 % 100 mL (2 mg/mL) infusion 4 mg/hr (07/01/23 1118)   PRN Meds: acetaminophen **OR** acetaminophen, hydrALAZINE, ondansetron **OR** ondansetron (ZOFRAN) IV  Time spent: 55 minutes  Author: Gillis Santa. MD [facility] 07/01/2023 2:55 PM  To reach On-call, see care teams to locate the attending and reach out to them via [email or web address]. If 7PM-7AM,  please contact night-coverage If you still have difficulty reaching the attending provider, please page the [facility] (Director on Call) for [facility] on amion for assistance.

## 2023-07-01 NOTE — ED Notes (Signed)
Dr. Para March at the bedside.

## 2023-07-01 NOTE — Assessment & Plan Note (Signed)
Complicating factor to overall prognosis and care 

## 2023-07-01 NOTE — Assessment & Plan Note (Signed)
Elevated troponin, suspect demand ischemia History of small pericardial effusion June 2024 Hypertensive urgency/emergency Echo 03/18/2023 showed EF>55% and small pericardial effusion without hemodynamic compromise Given cardiomegaly on chest x-ray will repeat echo to evaluate size of pericardial effusion IV Lasix, oral hydralazine for blood pressure control.  Hold ACE/ARB's for now.  Consider beta-blockers prior to discharge Hold amlodipine for possible contribution to lower extremity edema Continue to trend troponin Cardiology consulted

## 2023-07-01 NOTE — Assessment & Plan Note (Signed)
Suspected OSA Patient with recently did a home sleep study and is awaiting the results

## 2023-07-01 NOTE — ED Notes (Signed)
Pt provided a menu to order her meals for the day, callbell within reach. No additional concerns at this time.

## 2023-07-01 NOTE — ED Notes (Signed)
Pt brief changed and pt assisted to reposition in bed

## 2023-07-01 NOTE — Progress Notes (Signed)
*  PRELIMINARY RESULTS* Echocardiogram 2D Echocardiogram has been performed.  Shannon Cox 07/01/2023, 2:24 PM

## 2023-07-02 DIAGNOSIS — I5031 Acute diastolic (congestive) heart failure: Secondary | ICD-10-CM | POA: Diagnosis not present

## 2023-07-02 DIAGNOSIS — N189 Chronic kidney disease, unspecified: Secondary | ICD-10-CM

## 2023-07-02 DIAGNOSIS — N049 Nephrotic syndrome with unspecified morphologic changes: Secondary | ICD-10-CM

## 2023-07-02 DIAGNOSIS — N179 Acute kidney failure, unspecified: Secondary | ICD-10-CM | POA: Diagnosis not present

## 2023-07-02 LAB — BASIC METABOLIC PANEL
Anion gap: 8 (ref 5–15)
BUN: 40 mg/dL — ABNORMAL HIGH (ref 6–20)
CO2: 24 mmol/L (ref 22–32)
Calcium: 8.2 mg/dL — ABNORMAL LOW (ref 8.9–10.3)
Chloride: 106 mmol/L (ref 98–111)
Creatinine, Ser: 3.4 mg/dL — ABNORMAL HIGH (ref 0.44–1.00)
GFR, Estimated: 16 mL/min — ABNORMAL LOW (ref >60)
Glucose, Bld: 142 mg/dL — ABNORMAL HIGH (ref 70–99)
Potassium: 4.3 mmol/L (ref 3.5–5.1)
Sodium: 138 mmol/L (ref 135–145)

## 2023-07-02 LAB — GLUCOSE, CAPILLARY
Glucose-Capillary: 86 mg/dL (ref 70–99)
Glucose-Capillary: 124 mg/dL — ABNORMAL HIGH (ref 70–99)
Glucose-Capillary: 97 mg/dL (ref 70–99)
Glucose-Capillary: 122 mg/dL — ABNORMAL HIGH (ref 70–99)

## 2023-07-02 LAB — PHOSPHORUS: Phosphorus: 4.8 mg/dL — ABNORMAL HIGH (ref 2.5–4.6)

## 2023-07-02 LAB — CBC
HCT: 30.6 % — ABNORMAL LOW (ref 36.0–46.0)
Hemoglobin: 9.4 g/dL — ABNORMAL LOW (ref 12.0–15.0)
MCH: 23.3 pg — ABNORMAL LOW (ref 26.0–34.0)
MCHC: 30.7 g/dL (ref 30.0–36.0)
MCV: 75.9 fL — ABNORMAL LOW (ref 80.0–100.0)
Platelets: 317 10*3/uL (ref 150–400)
RBC: 4.03 MIL/uL (ref 3.87–5.11)
RDW: 20.6 % — ABNORMAL HIGH (ref 11.5–15.5)
WBC: 6.4 10*3/uL (ref 4.0–10.5)
nRBC: 0 % (ref 0.0–0.2)

## 2023-07-02 LAB — MAGNESIUM: Magnesium: 1.8 mg/dL (ref 1.7–2.4)

## 2023-07-02 MED ORDER — VITAMIN B-12 1000 MCG PO TABS: 1000.0000 ug | ORAL_TABLET | Freq: Every day | ORAL | Status: DC

## 2023-07-02 MED ORDER — ENOXAPARIN SODIUM 30 MG/0.3ML IJ SOSY
30.0000 mg | PREFILLED_SYRINGE | INTRAMUSCULAR | Status: DC
  Administered 2023-07-02 – 2023-07-04 (×3): 30 mg via SUBCUTANEOUS
  Filled 2023-07-02 (×3): qty 0.3

## 2023-07-02 MED ORDER — ISOSORBIDE MONONITRATE ER 30 MG PO TB24
30.0000 mg | ORAL_TABLET | Freq: Every day | ORAL | Status: DC
  Administered 2023-07-02: 30 mg via ORAL
  Filled 2023-07-02 (×2): qty 1

## 2023-07-02 MED ORDER — SODIUM CHLORIDE 0.9 % IV SOLN
1.0000 g | INTRAVENOUS | Status: DC
  Administered 2023-07-02: 1 g via INTRAVENOUS
  Filled 2023-07-02 (×2): qty 10

## 2023-07-02 MED ORDER — CYANOCOBALAMIN 1000 MCG/ML IJ SOLN
1000.0000 ug | Freq: Every day | INTRAMUSCULAR | Status: DC
  Administered 2023-07-02 – 2023-07-06 (×5): 1000 ug via INTRAMUSCULAR
  Filled 2023-07-02 (×6): qty 1

## 2023-07-02 MED ORDER — VITAMIN D (ERGOCALCIFEROL) 1.25 MG (50000 UNIT) PO CAPS
50000.0000 [IU] | ORAL_CAPSULE | ORAL | Status: DC
  Administered 2023-07-02: 50000 [IU] via ORAL
  Filled 2023-07-02: qty 1

## 2023-07-02 NOTE — Evaluation (Signed)
Physical Therapy Evaluation Patient Details Name: Shannon Cox MRN: [id number] DOB: Feb 23, 1971 Today's Date: 07/02/2023  History of Present Illness  Pt is a 52 y.o. 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, ambulant with cane, CKD 3a with anemia of CKD, morbid obesity, recurrent pancreatitis, hospitalized  at [facility] in June 2024 for nausea vomiting and diarrhea, discharged to outpatient GI for upper and lower endoscopy, and who during that admission was found to have lower extremity edema, placed on Lasix and discharged with outpatient follow-up to nephrology to evaluate for nephrotic syndrome, who presents to the ED with a complaint of shortness of breath that acutely worsened on the evening of presentation while lying flat, improved somewhat by sitting upright.  Clinical Impression   Pt is seen by OT and PT for co-evaluation to address functional ADLs. At baseline, Pt reports amb in home with cane occasionally and rollator for community amb, difficulty with rolling and bed positioning, and chronic LBP. Pt performs bed mobility mod I, transfers and amb sup A-CGA for safety. During standing activities Pt required 1 hand held assist for increased stability due to unsteadiness. Pt able to amb approx 100 ft using RW but required standing breaks due to decreased endurance and increase of LBP. Pt limited amb distance secondary to onset of LBP. Educated Pt on use of AD for household amb instead of furniture walking to reduce risk of falls-Pt verbalized understanding. Pt currently goes to OP PT and would like to continue services upon discharge. Pt would benefit from skilled PT to address above deficits and promote optimal return to PLOF.       If plan is discharge home, recommend the following: A little help with walking and/or transfers;A little help with bathing/dressing/bathroom;Assist for  transportation;Help with stairs or ramp for entrance   Can travel by private vehicle        Equipment Recommendations Rolling walker (2 wheels)  Recommendations for Other Services       Functional Status Assessment Patient has had a recent decline in their functional status and demonstrates the ability to make significant improvements in function in a reasonable and predictable amount of time.     Precautions / Restrictions Precautions Precaution Comments: chronic LBP Restrictions Weight Bearing Restrictions: No      Mobility  Bed Mobility Overal bed mobility: Modified Independent             General bed mobility comments: requires extended time to complete bed mobility with use of hand rails and elevated HOB    Transfers Overall transfer level: Needs assistance Equipment used: Rolling walker (2 wheels) Transfers: Sit to/from Stand Sit to Stand: Contact guard assist, Supervision           General transfer comment: able to perform STS with CGA for stability but otherwise close sup A    Ambulation/Gait Ambulation/Gait assistance: Supervision Gait Distance (Feet): 100 Feet Assistive device: Rolling walker (2 wheels) Gait Pattern/deviations: Step-through pattern, Decreased stride length, Trunk flexed Gait velocity: slightly decreased     General Gait Details: slight forward flex trunk; required a couple of standing breaks due to poor endurance  Stairs            Wheelchair Mobility     Tilt Bed    Modified Rankin (Stroke Patients Only)       Balance Overall balance assessment: Needs assistance Sitting-balance support: Feet supported Sitting balance-Leahy Scale: Good Sitting balance - Comments:  able to maintain seated EOB balance during functional activities   Standing balance support: Single extremity supported, During functional activity, Reliant on assistive device for balance Standing balance-Leahy Scale: Fair Standing balance comment:  brief moment of 1 hand held assist for stability during RW height adjustment due to unsteadiness                             Pertinent Vitals/Pain Pain Assessment Pain Assessment: 0-10 Pain Score: 8  Pain Location: LBP Pain Descriptors / Indicators: Aching, Discomfort Pain Intervention(s): Limited activity within patient's tolerance, Monitored during session, Repositioned    Home Living Family/patient expects to be discharged to:: Private residence Living Arrangements: Children;Other relatives (grandchild) Available Help at Discharge: Family;Available PRN/intermittently Type of Home: House Home Access: Ramped entrance       Home Layout: One level Home Equipment: Rollator (4 wheels);Electric scooter Additional Comments: bars beside bed to assist with rolling and pending trapeze    Prior Function Prior Level of Function : Independent/Modified Independent;History of Falls (last six months);Working/employed             Mobility Comments: 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       Extremity/Trunk Assessment   Upper Extremity Assessment Upper Extremity Assessment: Defer to OT evaluation    Lower Extremity Assessment Lower Extremity Assessment: Generalized weakness       Communication   Communication Communication: No apparent difficulties Cueing Techniques: Verbal cues  Cognition Arousal: Alert Behavior During Therapy: WFL for tasks assessed/performed Overall Cognitive Status: Within Functional Limits for tasks assessed                                 General Comments: AO x4; pleasant and cooperative with therapy        General Comments General comments (skin integrity, edema, etc.): Pt reports 8/10 NPS LBP during amb but did not want medication    Exercises Other Exercises Other Exercises: Edu Pt on use of AD instead of furniture walking due to increase fall risk   Assessment/Plan    PT  Assessment Patient needs continued PT services  PT Problem List Decreased strength;Decreased mobility;Decreased range of motion;Decreased activity tolerance;Decreased balance;Pain;Obesity       PT Treatment Interventions DME instruction;Gait training;Functional mobility training;Therapeutic activities;Therapeutic exercise;Balance training;Neuromuscular re-education    PT Goals (Current goals can be found in the Care Plan section)  Acute Rehab PT Goals Patient Stated Goal: to go home PT Goal Formulation: With patient Time For Goal Achievement: 07/16/23 Potential to Achieve Goals: Good    Frequency Min 1X/week     Co-evaluation PT/OT/SLP Co-Evaluation/Treatment: Yes Reason for Co-Treatment: To address functional/ADL transfers;Necessary to address cognition/behavior during functional activity PT goals addressed during session: Mobility/safety with mobility;Proper use of DME;Balance         AM-PAC PT "6 Clicks" Mobility  Outcome Measure Help needed turning from your back to your side while in a flat bed without using bedrails?: A Little Help needed moving from lying on your back to sitting on the side of a flat bed without using bedrails?: A Little Help needed moving to and from a bed to a chair (including a wheelchair)?: A Little Help needed standing up from a chair using your arms (e.g., wheelchair or bedside chair)?: A Little Help needed to walk in hospital room?: A Little Help needed climbing 3-5  steps with a railing? : A Lot 6 Click Score: 17    End of Session   Activity Tolerance: Patient tolerated treatment well Patient left: in bed;with call bell/phone within reach;with bed alarm set Nurse Communication: Mobility status PT Visit Diagnosis: Unsteadiness on feet (R26.81);Repeated falls (R29.6);History of falling (Z91.81);Muscle weakness (generalized) (M62.81);Pain Pain - Right/Left:  (bilateral) Pain - part of body:  (low back)    Time: 4098-1191 PT Time Calculation  (min) (ACUTE ONLY): 18 min   Charges:                 Elmon Else, SPT   Blake Vetrano 07/02/2023, 2:18 PM

## 2023-07-02 NOTE — Progress Notes (Signed)
[facility] Progress Note  Patient: Shannon Cox    QIO:[id number]  DOA: 06/30/2023     Date of Service: the patient was seen and examined on 07/02/2023  Chief Complaint  Patient presents with   Shortness of Breath   Brief hospital course: Nandika Landaverde is a 52 y.o. 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, ambulant with cane, CKD 3a with anemia of CKD, morbid obesity, recurrent pancreatitis, hospitalized  at [facility] in June 2024 for nausea vomiting and diarrhea, discharged to outpatient GI for upper and lower endoscopy, and who during that admission was found to have lower extremity edema, placed on Lasix and discharged with outpatient follow-up to nephrology to evaluate for nephrotic syndrome, who presents to the ED with a complaint of shortness of breath that acutely worsened on the evening of presentation while lying flat, improved somewhat by sitting upright.  She had an upper endoscopy the day prior at [facility] that showed gastritis, biopsies pending.  She reports that her lower extremity edema has been worsening for the past week.   ED course and data review: BP in the ED in the 170s to 180s with pulse in the 90s and otherwise normal vitals.  O2 sats in the high 90s on room air. Labs: Notable for troponin 27 and BNP 250.  WBC normal and hemoglobin 9.6.  Potassium 3.4.  Creatinine 3.33 up from baseline of 1.6.  EKG, personally viewed and interpreted showing NSR at 95 with no acute ST-T wave changes. Chest x-ray shows cardiomegaly with no active cardiopulmonary disease Patient treated with IV Lasix 40 mg and hospitalist consulted for admission.   Assessment and Plan: Acute heart failure  Elevated troponin, suspect demand ischemia History of small pericardial effusion June 2024 Hypertensive urgency/emergency Echo 03/18/2023 showed EF>55% and small pericardial effusion without  hemodynamic compromise Given cardiomegaly on chest x-ray will repeat echo to evaluate size of pericardial effusion S/p IV Lasix,  Continue oral hydralazine for blood pressure control.   Hold ACE/ARB's for now.   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  Hemoglobin at baseline Possibly medication related Hold Lasix 20 and valsartan 320 and will hold Jardiance as well Monitor for worsening in view of IV Lasix in the management of acute CHF Patient had an outpatient  nephrology referral for workup for nephrotic syndrome(had SPEP UPEP done while at [facility]) Nephrology consulted Urinalysis positive for UTI, urine protein> 3000, nephrotic range Nephrology consulted, recommended renal biopsy which cannot be done because patient was on aspirin which has been discontinued.  Renal biopsy can be done most likely on Tuesday  UTI, UA positive, urine culture growing E. coli, sensitive report is pending Started ceftriaxone 1 g IV daily  Hypokalemia, potassium repleted cautiously due to CKD 3.  Hypertensive urgency, uncontrolled blood pressure Increase hydralazine 50 mg p.o. 3 times daily Added metoprolol 50 mg p.o. twice daily 9/27 started Imdur 30 mg p.o. daily Continue Lasix IV infusion for diuresis Monitor BP and titrate medications accordingly   OSA (obstructive sleep apnea) Suspected OSA Patient with recently did a home sleep study and is awaiting the results   Diabetic neuropathy (HCC) Multimodal pain control.  Avoid NSAIDs  Obesity, Class III, BMI 40-49.9 (morbid obesity) (HCC) Complicating factor to overall prognosis and care   Insulin dependent type 2 diabetes mellitus.  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 Follow-up with PCP to repeat folic acid level after 3 to 6 months.  Vitamin B12 level 194, goal >400, started vitamin B12 1000 mcg IM injection daily during hospital stay followed by oral supplement. Follow-up with PCP to repeat vitamin B12 level after 3 to 6 months.  Vitamin D insufficiency, started vitamin D 50,000 units p.o. weekly.  Follow-up with PCP to repeat vitamin D level after 3 to 6 months  Body mass index is 42.27 kg/m.  Interventions:  Diet: Carb modified/heart healthy diet with fluid striction 1.5 L/day DVT Prophylaxis: Subcutaneous Lovenox   Advance goals of care discussion: Full code  Family Communication: family was not present at bedside, at the time of interview.  The pt provided permission to discuss medical plan with the family. Opportunity was given to ask question and all questions were answered satisfactorily.   Disposition:  Pt is from Home, admitted with CHF, and  AKI, still on IV Lasix infusion, which precludes a safe discharge. Discharge to Home with HH TBD after PT and OT eval, when stable, may need few days to stay in the hospital..  Subjective: No significant events overnight, patient was sleepy, she woke up and denied any active issues, no chest pain or palpitation, no shortness of breath.  Lower extremity edema is still significant  Physical Exam: General: NAD, lying comfortably Appear in no distress, affect appropriate Eyes: PERRLA ENT: Oral Mucosa Clear, moist  Neck: no JVD,  Cardiovascular: S1 and S2 Present, no Murmur,  Respiratory: good respiratory effort, Bilateral Air entry equal and Decreased, no Crackles, no wheezes Abdomen: Bowel Sound present, Soft and no tenderness,  Skin: no rashes Extremities: 4+ pedal edema, no calf  tenderness, chronic right foot drop Neurologic: without any new focal findings Gait not checked due to patient safety concerns  Vitals:   07/02/23 0925 07/02/23 0930 07/02/23 0935 07/02/23 1208  BP:    (!) 152/72  Pulse:    77  Resp: (!) 25 (!) 22 19 16   Temp:    97.8 F (36.6 C)  TempSrc:    Oral  SpO2:    98%  Weight:      Height:        Intake/Output Summary (Last 24 hours) at 07/02/2023 1318 Last data filed at 07/02/2023 0300 Gross per 24 hour  Intake 29.76 ml  Output 750 ml  Net -720.24 ml   Filed Weights   06/30/23 1800  Weight: 115.2 kg    Data Reviewed: I have personally reviewed and interpreted daily labs, tele strips, imagings as discussed above. I reviewed all nursing notes, pharmacy notes, vitals, pertinent old records I have discussed plan of care as described above with RN and patient/family.  CBC: Recent Labs  Lab 06/30/23 1808 07/01/23 0432 07/02/23 0252  WBC 7.2 9.1 6.4  HGB 9.8* 10.1* 9.4*  HCT 32.3* 32.5* 30.6*  MCV 77.5* 76.3* 75.9*  PLT 331 315 317   Basic Metabolic Panel: Recent Labs  Lab 06/30/23 1808 07/01/23 0432 07/01/23 1539 07/02/23 0252  NA 138 138 139 138  K 3.4* 3.0* 4.1 4.3  CL 108 109 107 106  CO2 21*  23 22 24   GLUCOSE 116* 127* 140* 142*  BUN 31* 33* 35* 40*  CREATININE 3.33* 3.23* 3.14* 3.40*  CALCIUM 8.3* 8.3* 8.1* 8.2*  MG  --  1.8  --  1.8  PHOS  --  4.2  --  4.8*    Studies: ECHOCARDIOGRAM COMPLETE  Result Date: 07/01/2023    ECHOCARDIOGRAM REPORT   Patient Name:   DECORA ALMANZA Silas Date of Exam: 07/01/2023 Medical Rec #:  [id number]             Height:       65.0 in Accession #:    [id number]            Weight:       254.0 lb Date of Birth:  July 27, 1971              BSA:          2.190 m Patient Age:    52 years              BP:           189/111 mmHg Patient Gender: F                     HR:           93 bpm. Exam Location:  [facility] Procedure: 2D Echo, Cardiac Doppler and Color Doppler Indications:     CHF I50.31   History:         Patient has prior history of Echocardiogram examinations, most                  recent 03/18/2023. Elevated Troponin; CKD3a; Risk                  Factors:Diabetes, Non-Smoker, Sleep Apnea and Hypertension.  Sonographer:     Dondra Prader RVT RCS Referring Phys:  [id number] Andris Baumann Diagnosing Phys: Julien Nordmann MD IMPRESSIONS  1. Left ventricular ejection fraction, by estimation, is 50 to 55%. The left ventricle has low normal function. The left ventricle has no regional wall motion abnormalities. There is moderate left ventricular hypertrophy. Left ventricular diastolic parameters are consistent with Grade II diastolic dysfunction (pseudonormalization).  2. Right ventricular systolic function is normal. The right ventricular size is normal.  3. A small pericardial effusion is present.1.17 cm off the LV free wall, 0.8 cm off the RV free wall.  4. The mitral valve is normal in structure. Mild mitral valve regurgitation. No evidence of mitral stenosis.  5. The aortic valve is normal in structure. Aortic valve regurgitation is not visualized. No aortic stenosis is present.  6. The inferior vena cava is dilated in size with >50% respiratory variability, suggesting right atrial pressure of 8 mmHg.  7. Left atrial size was mildly dilated. Comparison(s): Prior Echo done at [facility] NORMAL LEFT VENTRICULAR SYSTOLIC FUNCTION WITH MODERATE LVH NORMAL RIGHT VENTRICULAR SYSTOLIC FUNCTION NO VALVULAR STENOSIS MILD MITRAL AND TRICUSPID REGURGITAION DILATED IVC SMALL PEARDIAL EFFUSION WTIHOUT EVIDENCE OF HEMODYNAMIC COMPROMISE.  FINDINGS  Left Ventricle: Left ventricular ejection fraction, by estimation, is 50 to 55%. The left ventricle has low normal function. The left ventricle has no regional wall motion abnormalities. The left ventricular internal cavity size was normal in size. There is moderate left ventricular hypertrophy. Left ventricular diastolic parameters are consistent with Grade II diastolic  dysfunction (pseudonormalization). Right Ventricle: The right ventricular size is normal. No increase in right ventricular wall thickness. Right ventricular systolic function is normal. Left  Atrium: Left atrial size was mildly dilated. Right Atrium: Right atrial size was normal in size. Pericardium: A small pericardial effusion is present. The pericardial effusion is circumferential. There is no evidence of cardiac tamponade. Mitral Valve: The mitral valve is normal in structure. Mild mitral valve regurgitation. No evidence of mitral valve stenosis. Tricuspid Valve: The tricuspid valve is normal in structure. Tricuspid valve regurgitation is mild . No evidence of tricuspid stenosis. Aortic Valve: The aortic valve is normal in structure. Aortic valve regurgitation is not visualized. No aortic stenosis is present. Aortic valve mean gradient measures 5.0 mmHg. Aortic valve peak gradient measures 9.6 mmHg. Aortic valve area, by VTI measures 2.55 cm. Pulmonic Valve: The pulmonic valve was normal in structure. Pulmonic valve regurgitation is not visualized. No evidence of pulmonic stenosis. Aorta: The aortic root is normal in size and structure. Venous: The inferior vena cava is dilated in size with greater than 50% respiratory variability, suggesting right atrial pressure of 8 mmHg. IAS/Shunts: No atrial level shunt detected by color flow Doppler.  LEFT VENTRICLE PLAX 2D LVIDd:         5.00 cm   Diastology LVIDs:         3.50 cm   LV e' medial:   6.42 cm/s LV PW:         1.30 cm   LV E/e' medial: 16.2 LV IVS:        1.20 cm LVOT diam:     2.00 cm LV SV:         79 LV SV Index:   36 LVOT Area:     3.14 cm  RIGHT VENTRICLE             IVC RV Basal diam:  3.90 cm     IVC diam: 2.20 cm RV S prime:     14.30 cm/s TAPSE (M-mode): 2.0 cm LEFT ATRIUM             Index LA diam:        3.60 cm 1.64 cm/m LA Vol (A2C):   62.0 ml 28.29 ml/m LA Vol (A4C):   81.6 ml 37.27 ml/m LA Biplane Vol: 85.4 ml 39.00 ml/m  AORTIC VALVE                      PULMONIC VALVE AV Area (Vmax):    2.57 cm      PV Vmax:       0.96 m/s AV Area (Vmean):   2.48 cm      PV Peak grad:  3.6 mmHg AV Area (VTI):     2.55 cm AV Vmax:           155.00 cm/s AV Vmean:          109.000 cm/s AV VTI:            0.309 m AV Peak Grad:      9.6 mmHg AV Mean Grad:      5.0 mmHg LVOT Vmax:         127.00 cm/s LVOT Vmean:        86.200 cm/s LVOT VTI:          0.251 m LVOT/AV VTI ratio: 0.81  AORTA Ao Root diam: 2.70 cm Ao Asc diam:  3.10 cm MITRAL VALVE MV Area (PHT): 4.39 cm     SHUNTS MV Decel Time: 173 msec     Systemic VTI:  0.25 m MV E velocity: 104.00 cm/s  Systemic Diam:  2.00 cm MV A velocity: 72.60 cm/s MV E/A ratio:  1.43 Julien Nordmann MD Electronically signed by Julien Nordmann MD Signature Date/Time: 07/01/2023/6:40:03 PM    Final     Scheduled Meds:  enoxaparin (LOVENOX) injection  40 mg Subcutaneous Q24H   feeding supplement (GLUCERNA SHAKE)  237 mL Oral TID BM   folic acid  1 mg Oral Daily   hydrALAZINE  50 mg Oral Q8H   insulin aspart  0-20 Units Subcutaneous TID WC   insulin aspart  0-5 Units Subcutaneous QHS   insulin glargine-yfgn  20 Units Subcutaneous QHS   isosorbide mononitrate  30 mg Oral Daily   metoprolol tartrate  50 mg Oral BID   pantoprazole  20 mg Oral Daily   potassium chloride  20 mEq Oral Daily   rosuvastatin  20 mg Oral Daily   Continuous Infusions:  furosemide (LASIX) 200 mg in dextrose 5 % 100 mL (2 mg/mL) infusion 4 mg/hr (07/01/23 1118)   PRN Meds: acetaminophen **OR** acetaminophen, hydrALAZINE, ondansetron **OR** ondansetron (ZOFRAN) IV  Time spent: 55 minutes  Author: Gillis Santa. MD [facility] 07/02/2023 1:18 PM  To reach On-call, see care teams to locate the attending and reach out to them via [email or web address]. If 7PM-7AM, please contact night-coverage If you still have difficulty reaching the attending provider, please page the [facility] (Director on Call) for [facility] on amion for assistance.

## 2023-07-02 NOTE — Discharge Instructions (Signed)

## 2023-07-02 NOTE — Plan of Care (Signed)
  Problem: Activity: Goal: Capacity to carry out activities will improve Outcome: Progressing   Problem: Pain Managment: Goal: General experience of comfort will improve Outcome: Progressing   Problem: Safety: Goal: Ability to remain free from injury will improve Outcome: Progressing   Problem: Skin Integrity: Goal: Risk for impaired skin integrity will decrease Outcome: Progressing

## 2023-07-02 NOTE — Progress Notes (Signed)
[facility]  ROUNDING NOTE   Subjective:   Patient seen sitting at side of bed Alert and oriented Room air Appetite appropriate Denies pain Lower extremity edema slightly improved.   Creatinine 3.40  Objective:  Vital signs in last 24 hours:  Temp:  [97.8 F (36.6 C)-98.3 F (36.8 C)] 98.3 F (36.8 C) (09/27 0719) Pulse Rate:  [79-99] 86 (09/27 0719) Resp:  [1-36] 19 (09/27 0935) BP: (146-182)/(60-94) 152/60 (09/27 0719) SpO2:  [93 %-99 %] 98 % (09/27 0719)  Weight change:  Filed Weights   06/30/23 1800  Weight: 115.2 kg    Intake/Output: I/O last 3 completed shifts: In: 29.8 [I.V.:29.8] Out: 2150 [Urine:2150]   Intake/Output this shift:  No intake/output data recorded.  Physical Exam: General: NAD  Head: Normocephalic, atraumatic. Moist oral mucosal membranes  Eyes: Anicteric  Lungs:  Clear to auscultation, normal effort  Heart: Regular rate and rhythm  Abdomen:  Soft, nontender  Extremities:  3+ peripheral edema.  Neurologic: Alert and oriented, moving all four extremities  Skin: No lesions  Access: None    Basic Metabolic Panel: Recent Labs  Lab 06/30/23 1808 07/01/23 0432 07/01/23 1539 07/02/23 0252  NA 138 138 139 138  K 3.4* 3.0* 4.1 4.3  CL 108 109 107 106  CO2 21* 23 22 24   GLUCOSE 116* 127* 140* 142*  BUN 31* 33* 35* 40*  CREATININE 3.33* 3.23* 3.14* 3.40*  CALCIUM 8.3* 8.3* 8.1* 8.2*  MG  --  1.8  --  1.8  PHOS  --  4.2  --  4.8*    Liver Function Tests: Recent Labs  Lab 06/30/23 2228 07/01/23 0432  AST 13* 11*  ALT 11 10  ALKPHOS 75 76  BILITOT 0.6 0.2*  PROT 6.4* 6.2*  ALBUMIN 2.2* 2.1*   No results for input(s): "LIPASE", "AMYLASE" in the last 168 hours. No results for input(s): "AMMONIA" in the last 168 hours.  CBC: Recent Labs  Lab 06/30/23 1808 07/01/23 0432 07/02/23 0252  WBC 7.2 9.1 6.4  HGB 9.8* 10.1* 9.4*  HCT 32.3* 32.5* 30.6*  MCV 77.5* 76.3* 75.9*  PLT 331 315 317    Cardiac  Enzymes: No results for input(s): "CKTOTAL", "CKMB", "CKMBINDEX", "TROPONINI" in the last 168 hours.  BNP: Invalid input(s): "POCBNP"  CBG: Recent Labs  Lab 07/01/23 0754 07/01/23 1223 07/01/23 1656 07/01/23 2223 07/02/23 0720  GLUCAP 136* 80 141* 132* 86    Microbiology: Results for orders placed or performed in visit on 09/16/22  Microscopic Examination     Status: Abnormal   Collection Time: 09/16/22  9:00 AM   Urine  Result Value Ref Range Status   WBC, UA 11-30 (A) 0 - 5 /hpf Final   RBC, Urine 3-10 (A) 0 - 2 /hpf Final   Epithelial Cells (non renal) >10 (A) 0 - 10 /hpf Final   Casts Present (A) None seen /lpf Final   Cast Type Granular casts (A) N/A Final   Bacteria, UA Moderate (A) None seen/Few Final    Coagulation Studies: No results for input(s): "LABPROT", "INR" in the last 72 hours.  Urinalysis: Recent Labs    07/01/23 0432  COLORURINE YELLOW*  LABSPEC 1.007  PHURINE 5.0  GLUCOSEU 150*  HGBUR SMALL*  BILIRUBINUR NEGATIVE  KETONESUR NEGATIVE  PROTEINUR >=300*  NITRITE NEGATIVE  LEUKOCYTESUR LARGE*      Imaging: ECHOCARDIOGRAM COMPLETE  Result Date: 07/01/2023    ECHOCARDIOGRAM REPORT   Patient Name:   Shannon Cox Date of  Exam: 07/01/2023 Medical Rec #:  [id number]             Height:       65.0 in Accession #:    [id number]            Weight:       254.0 lb Date of Birth:  01-10-1971              BSA:          2.190 m Patient Age:    52 years              BP:           189/111 mmHg Patient Gender: F                     HR:           93 bpm. Exam Location:  [facility] Procedure: 2D Echo, Cardiac Doppler and Color Doppler Indications:     CHF I50.31  History:         Patient has prior history of Echocardiogram examinations, most                  recent 03/18/2023. Elevated Troponin; CKD3a; Risk                  Factors:Diabetes, Non-Smoker, Sleep Apnea and Hypertension.  Sonographer:     Dondra Prader RVT RCS Referring Phys:  [id number] Andris Baumann  Diagnosing Phys: Julien Nordmann MD IMPRESSIONS  1. Left ventricular ejection fraction, by estimation, is 50 to 55%. The left ventricle has low normal function. The left ventricle has no regional wall motion abnormalities. There is moderate left ventricular hypertrophy. Left ventricular diastolic parameters are consistent with Grade II diastolic dysfunction (pseudonormalization).  2. Right ventricular systolic function is normal. The right ventricular size is normal.  3. A small pericardial effusion is present.1.17 cm off the LV free wall, 0.8 cm off the RV free wall.  4. The mitral valve is normal in structure. Mild mitral valve regurgitation. No evidence of mitral stenosis.  5. The aortic valve is normal in structure. Aortic valve regurgitation is not visualized. No aortic stenosis is present.  6. The inferior vena cava is dilated in size with >50% respiratory variability, suggesting right atrial pressure of 8 mmHg.  7. Left atrial size was mildly dilated. Comparison(s): Prior Echo done at [facility] NORMAL LEFT VENTRICULAR SYSTOLIC FUNCTION WITH MODERATE LVH NORMAL RIGHT VENTRICULAR SYSTOLIC FUNCTION NO VALVULAR STENOSIS MILD MITRAL AND TRICUSPID REGURGITAION DILATED IVC SMALL PEARDIAL EFFUSION WTIHOUT EVIDENCE OF HEMODYNAMIC COMPROMISE.  FINDINGS  Left Ventricle: Left ventricular ejection fraction, by estimation, is 50 to 55%. The left ventricle has low normal function. The left ventricle has no regional wall motion abnormalities. The left ventricular internal cavity size was normal in size. There is moderate left ventricular hypertrophy. Left ventricular diastolic parameters are consistent with Grade II diastolic dysfunction (pseudonormalization). Right Ventricle: The right ventricular size is normal. No increase in right ventricular wall thickness. Right ventricular systolic function is normal. Left Atrium: Left atrial size was mildly dilated. Right Atrium: Right atrial size was normal in size. Pericardium: A small  pericardial effusion is present. The pericardial effusion is circumferential. There is no evidence of cardiac tamponade. Mitral Valve: The mitral valve is normal in structure. Mild mitral valve regurgitation. No evidence of mitral valve stenosis. Tricuspid Valve: The tricuspid valve is normal in structure. Tricuspid valve regurgitation is mild . No evidence of tricuspid  stenosis. Aortic Valve: The aortic valve is normal in structure. Aortic valve regurgitation is not visualized. No aortic stenosis is present. Aortic valve mean gradient measures 5.0 mmHg. Aortic valve peak gradient measures 9.6 mmHg. Aortic valve area, by VTI measures 2.55 cm. Pulmonic Valve: The pulmonic valve was normal in structure. Pulmonic valve regurgitation is not visualized. No evidence of pulmonic stenosis. Aorta: The aortic root is normal in size and structure. Venous: The inferior vena cava is dilated in size with greater than 50% respiratory variability, suggesting right atrial pressure of 8 mmHg. IAS/Shunts: No atrial level shunt detected by color flow Doppler.  LEFT VENTRICLE PLAX 2D LVIDd:         5.00 cm   Diastology LVIDs:         3.50 cm   LV e' medial:   6.42 cm/s LV PW:         1.30 cm   LV E/e' medial: 16.2 LV IVS:        1.20 cm LVOT diam:     2.00 cm LV SV:         79 LV SV Index:   36 LVOT Area:     3.14 cm  RIGHT VENTRICLE             IVC RV Basal diam:  3.90 cm     IVC diam: 2.20 cm RV S prime:     14.30 cm/s TAPSE (M-mode): 2.0 cm LEFT ATRIUM             Index LA diam:        3.60 cm 1.64 cm/m LA Vol (A2C):   62.0 ml 28.29 ml/m LA Vol (A4C):   81.6 ml 37.27 ml/m LA Biplane Vol: 85.4 ml 39.00 ml/m  AORTIC VALVE                     PULMONIC VALVE AV Area (Vmax):    2.57 cm      PV Vmax:       0.96 m/s AV Area (Vmean):   2.48 cm      PV Peak grad:  3.6 mmHg AV Area (VTI):     2.55 cm AV Vmax:           155.00 cm/s AV Vmean:          109.000 cm/s AV VTI:            0.309 m AV Peak Grad:      9.6 mmHg AV Mean Grad:       5.0 mmHg LVOT Vmax:         127.00 cm/s LVOT Vmean:        86.200 cm/s LVOT VTI:          0.251 m LVOT/AV VTI ratio: 0.81  AORTA Ao Root diam: 2.70 cm Ao Asc diam:  3.10 cm MITRAL VALVE MV Area (PHT): 4.39 cm     SHUNTS MV Decel Time: 173 msec     Systemic VTI:  0.25 m MV E velocity: 104.00 cm/s  Systemic Diam: 2.00 cm MV A velocity: 72.60 cm/s MV E/A ratio:  1.43 Julien Nordmann MD Electronically signed by Julien Nordmann MD Signature Date/Time: 07/01/2023/6:40:03 PM    Final    DG Chest 2 View  Result Date: 06/30/2023 CLINICAL DATA:  Shortness of breath EXAM: CHEST - 2 VIEW COMPARISON:  Chest x-ray 08/24/2022 FINDINGS: The heart is enlarged. There is no focal lung infiltrate, pleural effusion or pneumothorax. No acute  fractures are seen. IMPRESSION: 1. No active cardiopulmonary disease. 2. Cardiomegaly. Electronically Signed   By: Darliss Cheney M.D.   On: 06/30/2023 19:34     Medications:    furosemide (LASIX) 200 mg in dextrose 5 % 100 mL (2 mg/mL) infusion 4 mg/hr (07/01/23 1118)    enoxaparin (LOVENOX) injection  40 mg Subcutaneous Q24H   feeding supplement (GLUCERNA SHAKE)  237 mL Oral TID BM   folic acid  1 mg Oral Daily   hydrALAZINE  50 mg Oral Q8H   insulin aspart  0-20 Units Subcutaneous TID WC   insulin aspart  0-5 Units Subcutaneous QHS   insulin glargine-yfgn  20 Units Subcutaneous QHS   isosorbide mononitrate  30 mg Oral Daily   metoprolol tartrate  50 mg Oral BID   pantoprazole  20 mg Oral Daily   potassium chloride  20 mEq Oral Daily   rosuvastatin  20 mg Oral Daily   acetaminophen **OR** acetaminophen, hydrALAZINE, ondansetron **OR** ondansetron (ZOFRAN) IV  Assessment/ Plan:  Shannon Cox is a 51 y.o.  female with medical problems of hypertension, insulin-dependent diabetes with neuropathy and retinopathy, foot drop, chronic kidney disease, anemia, morbid obesity, recurrent pancreatitis, was admitted on 06/30/2023 for Shortness of breath [R06.02] Acute CHF  (HCC) [I50.9] AKI (acute kidney injury) (HCC) [N17.9]   Acute kidney injury on chronic kidney disease stage IIIb. Underlying CKD risk factors include poorly controlled diabetes, hypertension, obesity, use of nonsteroidals. Baseline creatinine of 1.85, GFR 33 from 04/14/2023.  Urine protein to creatinine ratio of 13 g. Empagliflozin, valsartan on hold.  Creatinine remains elevated during diuretic therapy. Continue monitoring. No acute indication for dialysis.   Lab Results  Component Value Date   CREATININE 3.40 (H) 07/02/2023   CREATININE 3.14 (H) 07/01/2023   CREATININE 3.23 (H) 07/01/2023    Intake/Output Summary (Last 24 hours) at 07/02/2023 1006 Last data filed at 07/02/2023 0300 Gross per 24 hour  Intake 29.76 ml  Output 750 ml  Net -720.24 ml   2. Diabetes type 2 with CKD Most recent hemoglobin A1c 7.1%. Current regimen includes insulin Semglee  Glucose well controlled   3. Nephrotic proteinuria Urine protein to creatinine ratio of 13 g as outpatient. Serologies negative.  Most likely it is related to diabetes. Patient would like to get her kidney biopsy done while inpatient is possible. Will need anxiety medication prior to procedure.   4. Hypertension with lower extremity edema Current regimen includes hydralazine, metoprolol. Volume control with IV furosemide infusion. Will monitor urine output and daily standing weights.    LOS: 1 Shannon Cox 9/27/202410:06 AM

## 2023-07-02 NOTE — Plan of Care (Signed)

## 2023-07-02 NOTE — Evaluation (Signed)
Occupational Therapy Evaluation Patient Details Name: Shannon Cox MRN: [id number] DOB: [birth year] Today's Date: 07/02/2023   History of Present Illness Pt is a 52 y.o. 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, ambulant with cane, CKD 3a with anemia of CKD, morbid obesity, recurrent pancreatitis. Pt presents to the ED with a complaint of shortness of breath that acutely worsened on the evening of presentation while lying flat, improved somewhat by sitting upright.   Clinical Impression   Ms Trompeter was seen for OT evaluation this date. Prior to hospital admission, pt was MOD I using rollator outside of house, furniture walking inside of house. Pt lives with daughter and grandchild. Pt presents to acute OT demonstrating impaired ADL performance and functional mobility 2/2 decreased activity tolerance and functional ROM/balance deficits. Pt currently requires MOD A for LB access seated EOB. SBA + RW for ADL t/f,  limited by chronic back pain ~80 ft. CGA + single UE support static standing. Pt would benefit from skilled OT to address noted impairments and functional limitations (see below for any additional details). Upon hospital discharge, recommend no OT follow up.     If plan is discharge home, recommend the following: A little help with walking and/or transfers;A little help with bathing/dressing/bathroom;Help with stairs or ramp for entrance    Functional Status Assessment  Patient has had a recent decline in their functional status and demonstrates the ability to make significant improvements in function in a reasonable and predictable amount of time.  Equipment Recommendations  Other (comment) (RW)    Recommendations for Other Services       Precautions / Restrictions Precautions Precautions: Fall Precaution Comments: chronic LBP Restrictions Weight Bearing Restrictions: No       Mobility Bed Mobility Overal bed mobility: Modified Independent                  Transfers Overall transfer level: Needs assistance Equipment used: None Transfers: Sit to/from Stand Sit to Stand: Contact guard assist                  Balance Overall balance assessment: Needs assistance Sitting-balance support: Feet supported Sitting balance-Leahy Scale: Normal     Standing balance support: Single extremity supported, During functional activity Standing balance-Leahy Scale: Fair                             ADL either performed or assessed with clinical judgement   ADL Overall ADL's : Needs assistance/impaired                                       General ADL Comments: MOD A for LB access seated EOB. SBA + RW for ADL t/f, single UE support static standing      Pertinent Vitals/Pain Pain Assessment Pain Assessment: 0-10 Pain Score: 8  Pain Location: LBP Pain Descriptors / Indicators: Aching, Discomfort Pain Intervention(s): Limited activity within patient's tolerance, Repositioned     Extremity/Trunk Assessment Upper Extremity Assessment Upper Extremity Assessment: Overall WFL for tasks assessed   Lower Extremity Assessment Lower Extremity Assessment: Generalized weakness;RLE deficits/detail RLE Deficits / Details: hx R hip pain limiting AROM       Communication Communication Communication: No apparent difficulties Cueing Techniques: Verbal cues   Cognition Arousal: Alert Behavior During Therapy: [facility]  for tasks assessed/performed Overall Cognitive Status: Within Functional Limits for tasks assessed                                 General Comments: AO x4; pleasant and cooperative with therapy     General Comments  Pt reports 8/10 NPS LBP during amb but did not want medication    Exercises Other Exercises Other Exercises: educated on falls prevention strategies   Shoulder Instructions      Home  Living Family/patient expects to be discharged to:: Private residence Living Arrangements: Children;Other relatives Available Help at Discharge: Family;Available PRN/intermittently Type of Home: House Home Access: Ramped entrance     Home Layout: One level     Bathroom Shower/Tub: Tub/shower unit         Home Equipment: Rollator (4 wheels);Electric scooter   Additional Comments: bars beside bed to assist with rolling and pending trapeze      Prior Functioning/Environment Prior Level of Function : Independent/Modified Independent;History of Falls (last six months);Working/employed             Mobility Comments: Pt is a Pharmacist, 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          OT Problem List: Decreased strength;Decreased range of motion;Decreased activity tolerance;Decreased safety awareness      OT Treatment/Interventions: Self-care/ADL training;Therapeutic exercise;Energy conservation;DME and/or AE instruction;Therapeutic activities    OT Goals(Current goals can be found in the care plan section) Acute Rehab OT Goals Patient Stated Goal: go home OT Goal Formulation: With patient Time For Goal Achievement: 07/16/23 Potential to Achieve Goals: Good ADL Goals Pt Will Perform Grooming: Independently;standing Pt Will Perform Lower Body Dressing: with modified independence;sit to/from stand Pt Will Transfer to Toilet: with modified independence;ambulating;regular height toilet  OT Frequency: Min 1X/week    Co-evaluation   Reason for Co-Treatment: To address functional/ADL transfers;Necessary to address cognition/behavior during functional activity PT goals addressed during session: Mobility/safety with mobility;Proper use of DME;Balance        AM-PAC OT "6 Clicks" Daily Activity     Outcome Measure Help from another person eating meals?: None Help from another person taking care of personal grooming?: A Little Help from another  person toileting, which includes using toliet, bedpan, or urinal?: A Little Help from another person bathing (including washing, rinsing, drying)?: A Little Help from another person to put on and taking off regular upper body clothing?: None Help from another person to put on and taking off regular lower body clothing?: A Lot 6 Click Score: 19   End of Session Equipment Utilized During Treatment: Rolling walker (2 wheels)  Activity Tolerance: Patient tolerated treatment well Patient left: in bed;with call bell/phone within reach  OT Visit Diagnosis: Unsteadiness on feet (R26.81);Other abnormalities of gait and mobility (R26.89)                Time: 1610-9604 OT Time Calculation (min): 24 min Charges:  OT General Charges $OT Visit: 1 Visit OT Evaluation $OT Eval Low Complexity: 1 Low OT Treatments $Self Care/Home Management : 8-22 mins  Kathie Dike, M.S. OTR/L  07/02/23, 2:41 PM  ascom [phone]

## 2023-07-02 NOTE — Progress Notes (Signed)
Rounding Note    Patient Name: Shannon Cox Date of Encounter: 07/02/2023  [facility] Cardiologist: New  Subjective   IV lasix changed to drip. Kidney function up today. Patient is overall feeling better. No chest pain, breathing is better.   Inpatient Medications    Scheduled Meds:  enoxaparin (LOVENOX) injection  40 mg Subcutaneous Q24H   feeding supplement (GLUCERNA SHAKE)  237 mL Oral TID BM   folic acid  1 mg Oral Daily   hydrALAZINE  50 mg Oral Q8H   insulin aspart  0-20 Units Subcutaneous TID WC   insulin aspart  0-5 Units Subcutaneous QHS   insulin glargine-yfgn  20 Units Subcutaneous QHS   metoprolol tartrate  50 mg Oral BID   pantoprazole  20 mg Oral Daily   potassium chloride  20 mEq Oral Daily   rosuvastatin  20 mg Oral Daily   Continuous Infusions:  furosemide (LASIX) 200 mg in dextrose 5 % 100 mL (2 mg/mL) infusion 4 mg/hr (07/01/23 1118)   PRN Meds: acetaminophen **OR** acetaminophen, hydrALAZINE, ondansetron **OR** ondansetron (ZOFRAN) IV   Vital Signs    Vitals:   07/01/23 2333 07/01/23 2333 07/02/23 0334 07/02/23 0719  BP: (!) 169/94 (!) 169/94 (!) 157/70 (!) 152/60  Pulse: 91 93 80 86  Resp: 18 18 18 16   Temp: 98.2 F (36.8 C) 98.2 F (36.8 C) 97.8 F (36.6 C) 98.3 F (36.8 C)  TempSrc:    Oral  SpO2: 97% 97% 94% 98%  Weight:      Height:        Intake/Output Summary (Last 24 hours) at 07/02/2023 9147 Last data filed at 07/02/2023 0300 Gross per 24 hour  Intake 29.76 ml  Output 750 ml  Net -720.24 ml      06/30/2023    6:00 PM 02/14/2023    8:44 PM 10/06/2022   12:52 PM  Last 3 Weights  Weight (lbs) 254 lb 246 lb 265 lb 14 oz  Weight (kg) 115.214 kg 111.585 kg 120.6 kg      Telemetry    NSR, PVCs, HT 70-80s - Personally Reviewed  ECG    NO new - Personally Reviewed  Physical Exam   GEN: No acute distress.   Neck: No JVD Cardiac: RRR, no murmurs, rubs, or gallops.  Respiratory: Clear to auscultation  bilaterally. GI: Soft, nontender, non-distended  MS: 1+ pedal edema; No deformity. Neuro:  Nonfocal  Psych: Normal affect   Labs    High Sensitivity Troponin:   Recent Labs  Lab 06/30/23 1808 06/30/23 2228  TROPONINIHS 22* 27*     Chemistry Recent Labs  Lab 06/30/23 2228 07/01/23 0432 07/01/23 1539 07/02/23 0252  NA  --  138 139 138  K  --  3.0* 4.1 4.3  CL  --  109 107 106  CO2  --  23 22 24   GLUCOSE  --  127* 140* 142*  BUN  --  33* 35* 40*  CREATININE  --  3.23* 3.14* 3.40*  CALCIUM  --  8.3* 8.1* 8.2*  MG  --  1.8  --  1.8  PROT 6.4* 6.2*  --   --   ALBUMIN 2.2* 2.1*  --   --   AST 13* 11*  --   --   ALT 11 10  --   --   ALKPHOS 75 76  --   --   BILITOT 0.6 0.2*  --   --   GFRNONAA  --  17* 17* 16*  ANIONGAP  --  6 10 8     Lipids No results for input(s): "CHOL", "TRIG", "HDL", "LABVLDL", "LDLCALC", "CHOLHDL" in the last 168 hours.  Hematology Recent Labs  Lab 06/30/23 1808 07/01/23 0432 07/02/23 0252  WBC 7.2 9.1 6.4  RBC 4.17 4.26 4.03  HGB 9.8* 10.1* 9.4*  HCT 32.3* 32.5* 30.6*  MCV 77.5* 76.3* 75.9*  MCH 23.5* 23.7* 23.3*  MCHC 30.3 31.1 30.7  RDW 20.6* 20.4* 20.6*  PLT 331 315 317   Thyroid No results for input(s): "TSH", "FREET4" in the last 168 hours.  BNP Recent Labs  Lab 06/30/23 1808  BNP 250.6*    DDimer No results for input(s): "DDIMER" in the last 168 hours.   Radiology    ECHOCARDIOGRAM COMPLETE  Result Date: 07/01/2023    ECHOCARDIOGRAM REPORT   Patient Name:   Shannon Cox Date of Exam: 07/01/2023 Medical Rec #:  [id number]             Height:       65.0 in Accession #:    [id number]            Weight:       254.0 lb Date of Birth:  12-Jul-1971              BSA:          2.190 m Patient Age:    52 years              BP:           189/111 mmHg Patient Gender: F                     HR:           93 bpm. Exam Location:  [facility] Procedure: 2D Echo, Cardiac Doppler and Color Doppler Indications:     CHF I50.31  History:          Patient has prior history of Echocardiogram examinations, most                  recent 03/18/2023. Elevated Troponin; CKD3a; Risk                  Factors:Diabetes, Non-Smoker, Sleep Apnea and Hypertension.  Sonographer:     Dondra Prader RVT RCS Referring Phys:  [id number] Andris Baumann Diagnosing Phys: Julien Nordmann MD IMPRESSIONS  1. Left ventricular ejection fraction, by estimation, is 50 to 55%. The left ventricle has low normal function. The left ventricle has no regional wall motion abnormalities. There is moderate left ventricular hypertrophy. Left ventricular diastolic parameters are consistent with Grade II diastolic dysfunction (pseudonormalization).  2. Right ventricular systolic function is normal. The right ventricular size is normal.  3. A small pericardial effusion is present.1.17 cm off the LV free wall, 0.8 cm off the RV free wall.  4. The mitral valve is normal in structure. Mild mitral valve regurgitation. No evidence of mitral stenosis.  5. The aortic valve is normal in structure. Aortic valve regurgitation is not visualized. No aortic stenosis is present.  6. The inferior vena cava is dilated in size with >50% respiratory variability, suggesting right atrial pressure of 8 mmHg.  7. Left atrial size was mildly dilated. Comparison(s): Prior Echo done at [facility] NORMAL LEFT VENTRICULAR SYSTOLIC FUNCTION WITH MODERATE LVH NORMAL RIGHT VENTRICULAR SYSTOLIC FUNCTION NO VALVULAR STENOSIS MILD MITRAL AND TRICUSPID REGURGITAION DILATED IVC SMALL PEARDIAL EFFUSION WTIHOUT EVIDENCE OF HEMODYNAMIC COMPROMISE.  FINDINGS  Left Ventricle: Left ventricular ejection fraction, by estimation, is 50 to 55%. The left ventricle has low normal function. The left ventricle has no regional wall motion abnormalities. The left ventricular internal cavity size was normal in size. There is moderate left ventricular hypertrophy. Left ventricular diastolic parameters are consistent with Grade II diastolic dysfunction  (pseudonormalization). Right Ventricle: The right ventricular size is normal. No increase in right ventricular wall thickness. Right ventricular systolic function is normal. Left Atrium: Left atrial size was mildly dilated. Right Atrium: Right atrial size was normal in size. Pericardium: A small pericardial effusion is present. The pericardial effusion is circumferential. There is no evidence of cardiac tamponade. Mitral Valve: The mitral valve is normal in structure. Mild mitral valve regurgitation. No evidence of mitral valve stenosis. Tricuspid Valve: The tricuspid valve is normal in structure. Tricuspid valve regurgitation is mild . No evidence of tricuspid stenosis. Aortic Valve: The aortic valve is normal in structure. Aortic valve regurgitation is not visualized. No aortic stenosis is present. Aortic valve mean gradient measures 5.0 mmHg. Aortic valve peak gradient measures 9.6 mmHg. Aortic valve area, by VTI measures 2.55 cm. Pulmonic Valve: The pulmonic valve was normal in structure. Pulmonic valve regurgitation is not visualized. No evidence of pulmonic stenosis. Aorta: The aortic root is normal in size and structure. Venous: The inferior vena cava is dilated in size with greater than 50% respiratory variability, suggesting right atrial pressure of 8 mmHg. IAS/Shunts: No atrial level shunt detected by color flow Doppler.  LEFT VENTRICLE PLAX 2D LVIDd:         5.00 cm   Diastology LVIDs:         3.50 cm   LV e' medial:   6.42 cm/s LV PW:         1.30 cm   LV E/e' medial: 16.2 LV IVS:        1.20 cm LVOT diam:     2.00 cm LV SV:         79 LV SV Index:   36 LVOT Area:     3.14 cm  RIGHT VENTRICLE             IVC RV Basal diam:  3.90 cm     IVC diam: 2.20 cm RV S prime:     14.30 cm/s TAPSE (M-mode): 2.0 cm LEFT ATRIUM             Index LA diam:        3.60 cm 1.64 cm/m LA Vol (A2C):   62.0 ml 28.29 ml/m LA Vol (A4C):   81.6 ml 37.27 ml/m LA Biplane Vol: 85.4 ml 39.00 ml/m  AORTIC VALVE                      PULMONIC VALVE AV Area (Vmax):    2.57 cm      PV Vmax:       0.96 m/s AV Area (Vmean):   2.48 cm      PV Peak grad:  3.6 mmHg AV Area (VTI):     2.55 cm AV Vmax:           155.00 cm/s AV Vmean:          109.000 cm/s AV VTI:            0.309 m AV Peak Grad:      9.6 mmHg AV Mean Grad:      5.0 mmHg LVOT Vmax:  127.00 cm/s LVOT Vmean:        86.200 cm/s LVOT VTI:          0.251 m LVOT/AV VTI ratio: 0.81  AORTA Ao Root diam: 2.70 cm Ao Asc diam:  3.10 cm MITRAL VALVE MV Area (PHT): 4.39 cm     SHUNTS MV Decel Time: 173 msec     Systemic VTI:  0.25 m MV E velocity: 104.00 cm/s  Systemic Diam: 2.00 cm MV A velocity: 72.60 cm/s MV E/A ratio:  1.43 Julien Nordmann MD Electronically signed by Julien Nordmann MD Signature Date/Time: 07/01/2023/6:40:03 PM    Final    DG Chest 2 View  Result Date: 06/30/2023 CLINICAL DATA:  Shortness of breath EXAM: CHEST - 2 VIEW COMPARISON:  Chest x-ray 08/24/2022 FINDINGS: The heart is enlarged. There is no focal lung infiltrate, pleural effusion or pneumothorax. No acute fractures are seen. IMPRESSION: 1. No active cardiopulmonary disease. 2. Cardiomegaly. Electronically Signed   By: Darliss Cheney M.D.   On: 06/30/2023 19:34    Cardiac Studies   Echo 07/01/23 1. Left ventricular ejection fraction, by estimation, is 50 to 55%. The  left ventricle has low normal function. The left ventricle has no regional  wall motion abnormalities. There is moderate left ventricular hypertrophy.  Left ventricular diastolic  parameters are consistent with Grade II diastolic dysfunction  (pseudonormalization).   2. Right ventricular systolic function is normal. The right ventricular  size is normal.   3. A small pericardial effusion is present.1.17 cm off the LV free wall,  0.8 cm off the RV free wall.   4. The mitral valve is normal in structure. Mild mitral valve  regurgitation. No evidence of mitral stenosis.   5. The aortic valve is normal in structure. Aortic valve  regurgitation is  not visualized. No aortic stenosis is present.   6. The inferior vena cava is dilated in size with >50% respiratory  variability, suggesting right atrial pressure of 8 mmHg.   7. Left atrial size was mildly dilated.   Patient Profile     52 y.o. female with a hx of hypertension, diabetes type 2 on insulin, peripheral neuropathy with right foot drop with ambulatory dysfunction, CKD stage III with anemia of CKD, morbid obesity, recurrent pancreatitis who is being seen 07/01/2023 for the evaluation of acute heart failure   Assessment & Plan    Acute diastolic heart failure - presented with progressive SOB and orthopnea for the last few months - BNP 250 and CXR non-acute started on IV lasix - echo this admission showed LVEF 50-55%, G2DD, small effusion, mild MR - IV lasix changed to drip - Net -2.1L - Scr/Bun up this AM, may need to hold lasix - albumin is low at 2.1> may need supplement - monitor daily weights, kidney function and strict I/Os   Elevated troponin - HS troponin 22>27 - no chest pain reported - continue to trend troponin - suspect mostly demand ischemia, but cannot rule out CAD - continue statin - may need stress test eventually. Not a good candidate for Cath/cardiac CTA given AKI/CKD.   HTN - intermittently elevated - Hydralazine 50mg  TID - metoprolol 50mg  BID - PTA valsartan and amlodipine held - start Imdur 30mg  daily   DM2 - A1C pending   AKI on CKD stage 3 - scr 3.33, BUN 31 - Scr was 1.28 eight months ago - PTA Valsartan held - Scr 3.4 this AM - trend with diuresis - may need to consult nephrology  Hypokalemia - supplement as needed   HLD - 132 in 03/2022 - start statin  For questions or updates, please contact [facility] Please consult [email or web address] for contact info under        Signed, Bartholomew Ramesh David Stall, PA-C  07/02/2023, 8:22 AM

## 2023-07-02 NOTE — Progress Notes (Signed)
PHARMACIST - PHYSICIAN COMMUNICATION  CONCERNING:  Enoxaparin (Lovenox) for DVT Prophylaxis    RECOMMENDATION: Patient was prescribed enoxaprin 40mg  q24 hours for VTE prophylaxis.   Filed Weights   06/30/23 1800  Weight: 115.2 kg (254 lb)    Body mass index is 42.27 kg/m.  Estimated Creatinine Clearance: 24.5 mL/min (A) (by C-G formula based on SCr of 3.4 mg/dL (H)).   Based on [facility] policy patient is candidate for enoxaparin 30mg  every 24 hours based on CrCl <91ml/min or Weight <45kg  DESCRIPTION: Pharmacy has adjusted enoxaparin dose per [facility] policy.  Patient is now receiving enoxaparin 30 mg every 24 hours    Darrius Montano Rodriguez-Guzman PharmD, BCPS 07/02/2023 4:04 PM

## 2023-07-03 DIAGNOSIS — N049 Nephrotic syndrome with unspecified morphologic changes: Secondary | ICD-10-CM

## 2023-07-03 DIAGNOSIS — N179 Acute kidney failure, unspecified: Secondary | ICD-10-CM | POA: Diagnosis not present

## 2023-07-03 DIAGNOSIS — I10 Essential (primary) hypertension: Secondary | ICD-10-CM

## 2023-07-03 DIAGNOSIS — E1121 Type 2 diabetes mellitus with diabetic nephropathy: Secondary | ICD-10-CM

## 2023-07-03 DIAGNOSIS — R7989 Other specified abnormal findings of blood chemistry: Secondary | ICD-10-CM

## 2023-07-03 DIAGNOSIS — I5031 Acute diastolic (congestive) heart failure: Secondary | ICD-10-CM | POA: Diagnosis not present

## 2023-07-03 LAB — URINE CULTURE: Culture: >=100000 — AB

## 2023-07-03 LAB — GLUCOSE, CAPILLARY
Glucose-Capillary: 123 mg/dL — ABNORMAL HIGH (ref 70–99)
Glucose-Capillary: 95 mg/dL (ref 70–99)
Glucose-Capillary: 102 mg/dL — ABNORMAL HIGH (ref 70–99)
Glucose-Capillary: 97 mg/dL (ref 70–99)

## 2023-07-03 LAB — BASIC METABOLIC PANEL
Anion gap: 8 (ref 5–15)
BUN: 48 mg/dL — ABNORMAL HIGH (ref 6–20)
CO2: 23 mmol/L (ref 22–32)
Calcium: 8.5 mg/dL — ABNORMAL LOW (ref 8.9–10.3)
Chloride: 109 mmol/L (ref 98–111)
Creatinine, Ser: 3.68 mg/dL — ABNORMAL HIGH (ref 0.44–1.00)
GFR, Estimated: 14 mL/min — ABNORMAL LOW (ref >60)
Glucose, Bld: 121 mg/dL — ABNORMAL HIGH (ref 70–99)
Potassium: 4.8 mmol/L (ref 3.5–5.1)
Sodium: 140 mmol/L (ref 135–145)

## 2023-07-03 LAB — CBC
HCT: 28.7 % — ABNORMAL LOW (ref 36.0–46.0)
Hemoglobin: 9 g/dL — ABNORMAL LOW (ref 12.0–15.0)
MCH: 23.4 pg — ABNORMAL LOW (ref 26.0–34.0)
MCHC: 31.4 g/dL (ref 30.0–36.0)
MCV: 74.7 fL — ABNORMAL LOW (ref 80.0–100.0)
Platelets: 314 10*3/uL (ref 150–400)
RBC: 3.84 MIL/uL — ABNORMAL LOW (ref 3.87–5.11)
RDW: 20.9 % — ABNORMAL HIGH (ref 11.5–15.5)
WBC: 7.3 10*3/uL (ref 4.0–10.5)
nRBC: 0 % (ref 0.0–0.2)

## 2023-07-03 LAB — PHOSPHORUS: Phosphorus: 5 mg/dL — ABNORMAL HIGH (ref 2.5–4.6)

## 2023-07-03 LAB — MAGNESIUM: Magnesium: 2 mg/dL (ref 1.7–2.4)

## 2023-07-03 MED ORDER — SODIUM CHLORIDE 0.9 % IV SOLN
1.0000 g | INTRAVENOUS | Status: DC
  Administered 2023-07-03: 1 g via INTRAVENOUS
  Filled 2023-07-03: qty 10

## 2023-07-03 MED ORDER — METOPROLOL TARTRATE 50 MG PO TABS
75.0000 mg | ORAL_TABLET | Freq: Two times a day (BID) | ORAL | Status: DC
  Administered 2023-07-03 – 2023-07-08 (×10): 75 mg via ORAL
  Filled 2023-07-03 (×10): qty 1

## 2023-07-03 MED ORDER — ISOSORBIDE MONONITRATE ER 60 MG PO TB24
60.0000 mg | ORAL_TABLET | Freq: Every day | ORAL | Status: DC
  Administered 2023-07-03 – 2023-07-08 (×6): 60 mg via ORAL
  Filled 2023-07-03 (×6): qty 1

## 2023-07-03 NOTE — Plan of Care (Signed)
  Problem: Education: Goal: Ability to demonstrate management of disease process will improve Outcome: Progressing   Problem: Activity: Goal: Capacity to carry out activities will improve Outcome: Progressing   Problem: Pain Managment: Goal: General experience of comfort will improve Outcome: Progressing   Problem: Safety: Goal: Ability to remain free from injury will improve Outcome: Progressing   Problem: Skin Integrity: Goal: Risk for impaired skin integrity will decrease Outcome: Progressing

## 2023-07-03 NOTE — Plan of Care (Signed)

## 2023-07-03 NOTE — Progress Notes (Signed)
Rounding Note    Patient Name: Shannon Cox Date of Encounter: 07/03/2023  [facility] Cardiologist: New  Subjective   Patient is overall feeling tired. She is reports coughing overnight. No chest pain. Scr up to 3.68, BUN 48. IV lasix still on hold. Nephrology is following.   Inpatient Medications    Scheduled Meds:  cyanocobalamin  1,000 mcg Intramuscular Q1200   Followed by   Melene Muller ON 07/09/2023] vitamin B-12  1,000 mcg Oral Daily   enoxaparin (LOVENOX) injection  30 mg Subcutaneous Q24H   feeding supplement (GLUCERNA SHAKE)  237 mL Oral TID BM   folic acid  1 mg Oral Daily   hydrALAZINE  50 mg Oral Q8H   insulin aspart  0-20 Units Subcutaneous TID WC   insulin aspart  0-5 Units Subcutaneous QHS   insulin glargine-yfgn  20 Units Subcutaneous QHS   isosorbide mononitrate  30 mg Oral Daily   metoprolol tartrate  50 mg Oral BID   pantoprazole  20 mg Oral Daily   potassium chloride  20 mEq Oral Daily   rosuvastatin  20 mg Oral Daily   Vitamin D (Ergocalciferol)  50,000 Units Oral Q7 days   Continuous Infusions:  cefTRIAXone (ROCEPHIN)  IV 1 g (07/02/23 2133)   furosemide (LASIX) 200 mg in dextrose 5 % 100 mL (2 mg/mL) infusion 4 mg/hr (07/01/23 1118)   PRN Meds: acetaminophen **OR** acetaminophen, hydrALAZINE, ondansetron **OR** ondansetron (ZOFRAN) IV   Vital Signs    Vitals:   07/02/23 1935 07/02/23 2347 07/03/23 0459 07/03/23 0844  BP: (!) 155/79 (!) 147/69 (!) 152/70 (!) 167/66  Pulse: 78 82 80 93  Resp: 18 18 18 17   Temp: 97.8 F (36.6 C) 98.3 F (36.8 C) 98.3 F (36.8 C) 98.4 F (36.9 C)  TempSrc:      SpO2: 100% 96% 100% 99%  Weight:      Height:        Intake/Output Summary (Last 24 hours) at 07/03/2023 0907 Last data filed at 07/03/2023 0400 Gross per 24 hour  Intake 1107.72 ml  Output --  Net 1107.72 ml      06/30/2023    6:00 PM 02/14/2023    8:44 PM 10/06/2022   12:52 PM  Last 3 Weights  Weight (lbs) 254 lb 246 lb 265  lb 14 oz  Weight (kg) 115.214 kg 111.585 kg 120.6 kg      Telemetry    NSR HR 80-90s - Personally Reviewed  ECG    No new - Personally Reviewed  Physical Exam   GEN: No acute distress.   Neck: No JVD Cardiac: RRR, no murmurs, rubs, or gallops.  Respiratory: Clear to auscultation bilaterally. GI: Soft, nontender, non-distended  MS: 1-2+ lower extreity edema; No deformity. Neuro:  Nonfocal  Psych: Normal affect   Labs    High Sensitivity Troponin:   Recent Labs  Lab 06/30/23 1808 06/30/23 2228  TROPONINIHS 22* 27*     Chemistry Recent Labs  Lab 06/30/23 2228 07/01/23 0432 07/01/23 1539 07/02/23 0252 07/03/23 0639  NA  --  138 139 138 140  K  --  3.0* 4.1 4.3 4.8  CL  --  109 107 106 109  CO2  --  23 22 24 23   GLUCOSE  --  127* 140* 142* 121*  BUN  --  33* 35* 40* 48*  CREATININE  --  3.23* 3.14* 3.40* 3.68*  CALCIUM  --  8.3* 8.1* 8.2* 8.5*  MG  --  1.8  --  1.8 2.0  PROT 6.4* 6.2*  --   --   --   ALBUMIN 2.2* 2.1*  --   --   --   AST 13* 11*  --   --   --   ALT 11 10  --   --   --   ALKPHOS 75 76  --   --   --   BILITOT 0.6 0.2*  --   --   --   GFRNONAA  --  17* 17* 16* 14*  ANIONGAP  --  6 10 8 8     Lipids No results for input(s): "CHOL", "TRIG", "HDL", "LABVLDL", "LDLCALC", "CHOLHDL" in the last 168 hours.  Hematology Recent Labs  Lab 07/01/23 0432 07/02/23 0252 07/03/23 0639  WBC 9.1 6.4 7.3  RBC 4.26 4.03 3.84*  HGB 10.1* 9.4* 9.0*  HCT 32.5* 30.6* 28.7*  MCV 76.3* 75.9* 74.7*  MCH 23.7* 23.3* 23.4*  MCHC 31.1 30.7 31.4  RDW 20.4* 20.6* 20.9*  PLT 315 317 314   Thyroid No results for input(s): "TSH", "FREET4" in the last 168 hours.  BNP Recent Labs  Lab 06/30/23 1808  BNP 250.6*    DDimer No results for input(s): "DDIMER" in the last 168 hours.   Radiology    ECHOCARDIOGRAM COMPLETE  Result Date: 07/01/2023    ECHOCARDIOGRAM REPORT   Patient Name:   KM WEATHERBEE Casserly Date of Exam: 07/01/2023 Medical Rec #:  [id number]              Height:       65.0 in Accession #:    [id number]            Weight:       254.0 lb Date of Birth:  11/05/1970              BSA:          2.190 m Patient Age:    52 years              BP:           189/111 mmHg Patient Gender: F                     HR:           93 bpm. Exam Location:  [facility] Procedure: 2D Echo, Cardiac Doppler and Color Doppler Indications:     CHF I50.31  History:         Patient has prior history of Echocardiogram examinations, most                  recent 03/18/2023. Elevated Troponin; CKD3a; Risk                  Factors:Diabetes, Non-Smoker, Sleep Apnea and Hypertension.  Sonographer:     Dondra Prader RVT RCS Referring Phys:  [id number] Andris Baumann Diagnosing Phys: Julien Nordmann MD IMPRESSIONS  1. Left ventricular ejection fraction, by estimation, is 50 to 55%. The left ventricle has low normal function. The left ventricle has no regional wall motion abnormalities. There is moderate left ventricular hypertrophy. Left ventricular diastolic parameters are consistent with Grade II diastolic dysfunction (pseudonormalization).  2. Right ventricular systolic function is normal. The right ventricular size is normal.  3. A small pericardial effusion is present.1.17 cm off the LV free wall, 0.8 cm off the RV free wall.  4. The mitral valve is normal in structure. Mild mitral valve regurgitation. No  evidence of mitral stenosis.  5. The aortic valve is normal in structure. Aortic valve regurgitation is not visualized. No aortic stenosis is present.  6. The inferior vena cava is dilated in size with >50% respiratory variability, suggesting right atrial pressure of 8 mmHg.  7. Left atrial size was mildly dilated. Comparison(s): Prior Echo done at [facility] NORMAL LEFT VENTRICULAR SYSTOLIC FUNCTION WITH MODERATE LVH NORMAL RIGHT VENTRICULAR SYSTOLIC FUNCTION NO VALVULAR STENOSIS MILD MITRAL AND TRICUSPID REGURGITAION DILATED IVC SMALL PEARDIAL EFFUSION WTIHOUT EVIDENCE OF HEMODYNAMIC COMPROMISE.  FINDINGS  Left  Ventricle: Left ventricular ejection fraction, by estimation, is 50 to 55%. The left ventricle has low normal function. The left ventricle has no regional wall motion abnormalities. The left ventricular internal cavity size was normal in size. There is moderate left ventricular hypertrophy. Left ventricular diastolic parameters are consistent with Grade II diastolic dysfunction (pseudonormalization). Right Ventricle: The right ventricular size is normal. No increase in right ventricular wall thickness. Right ventricular systolic function is normal. Left Atrium: Left atrial size was mildly dilated. Right Atrium: Right atrial size was normal in size. Pericardium: A small pericardial effusion is present. The pericardial effusion is circumferential. There is no evidence of cardiac tamponade. Mitral Valve: The mitral valve is normal in structure. Mild mitral valve regurgitation. No evidence of mitral valve stenosis. Tricuspid Valve: The tricuspid valve is normal in structure. Tricuspid valve regurgitation is mild . No evidence of tricuspid stenosis. Aortic Valve: The aortic valve is normal in structure. Aortic valve regurgitation is not visualized. No aortic stenosis is present. Aortic valve mean gradient measures 5.0 mmHg. Aortic valve peak gradient measures 9.6 mmHg. Aortic valve area, by VTI measures 2.55 cm. Pulmonic Valve: The pulmonic valve was normal in structure. Pulmonic valve regurgitation is not visualized. No evidence of pulmonic stenosis. Aorta: The aortic root is normal in size and structure. Venous: The inferior vena cava is dilated in size with greater than 50% respiratory variability, suggesting right atrial pressure of 8 mmHg. IAS/Shunts: No atrial level shunt detected by color flow Doppler.  LEFT VENTRICLE PLAX 2D LVIDd:         5.00 cm   Diastology LVIDs:         3.50 cm   LV e' medial:   6.42 cm/s LV PW:         1.30 cm   LV E/e' medial: 16.2 LV IVS:        1.20 cm LVOT diam:     2.00 cm LV SV:          79 LV SV Index:   36 LVOT Area:     3.14 cm  RIGHT VENTRICLE             IVC RV Basal diam:  3.90 cm     IVC diam: 2.20 cm RV S prime:     14.30 cm/s TAPSE (M-mode): 2.0 cm LEFT ATRIUM             Index LA diam:        3.60 cm 1.64 cm/m LA Vol (A2C):   62.0 ml 28.29 ml/m LA Vol (A4C):   81.6 ml 37.27 ml/m LA Biplane Vol: 85.4 ml 39.00 ml/m  AORTIC VALVE                     PULMONIC VALVE AV Area (Vmax):    2.57 cm      PV Vmax:       0.96 m/s AV Area (Vmean):  2.48 cm      PV Peak grad:  3.6 mmHg AV Area (VTI):     2.55 cm AV Vmax:           155.00 cm/s AV Vmean:          109.000 cm/s AV VTI:            0.309 m AV Peak Grad:      9.6 mmHg AV Mean Grad:      5.0 mmHg LVOT Vmax:         127.00 cm/s LVOT Vmean:        86.200 cm/s LVOT VTI:          0.251 m LVOT/AV VTI ratio: 0.81  AORTA Ao Root diam: 2.70 cm Ao Asc diam:  3.10 cm MITRAL VALVE MV Area (PHT): 4.39 cm     SHUNTS MV Decel Time: 173 msec     Systemic VTI:  0.25 m MV E velocity: 104.00 cm/s  Systemic Diam: 2.00 cm MV A velocity: 72.60 cm/s MV E/A ratio:  1.43 Julien Nordmann MD Electronically signed by Julien Nordmann MD Signature Date/Time: 07/01/2023/6:40:03 PM    Final     Cardiac Studies   Echo 07/01/23 1. Left ventricular ejection fraction, by estimation, is 50 to 55%. The  left ventricle has low normal function. The left ventricle has no regional  wall motion abnormalities. There is moderate left ventricular hypertrophy.  Left ventricular diastolic  parameters are consistent with Grade II diastolic dysfunction  (pseudonormalization).   2. Right ventricular systolic function is normal. The right ventricular  size is normal.   3. A small pericardial effusion is present.1.17 cm off the LV free wall,  0.8 cm off the RV free wall.   4. The mitral valve is normal in structure. Mild mitral valve  regurgitation. No evidence of mitral stenosis.   5. The aortic valve is normal in structure. Aortic valve regurgitation is  not  visualized. No aortic stenosis is present.   6. The inferior vena cava is dilated in size with >50% respiratory  variability, suggesting right atrial pressure of 8 mmHg.   7. Left atrial size was mildly dilated.   Patient Profile     52 y.o. female with a hx of hypertension, diabetes type 2 on insulin, peripheral neuropathy with right foot drop with ambulatory dysfunction, CKD stage III with anemia of CKD, morbid obesity, recurrent pancreatitis who is being seen 07/01/2023 for the evaluation of acute heart failure   Assessment & Plan    Acute diastolic heart failure - presented with progressive SOB and orthopnea for the last few months - BNP 250 and CXR non-acute started on IV lasix - echo this admission showed LVEF 50-55%, G2DD, small effusion, mild MR - IV lasix held for worsening kidney function - Net -1L - Scr/Bun continue to rise, nephrology has been consulted - albumin is low at 2.1> may need supplement - suspect low albumin and nephrotic syndrome contributing to swelling   Elevated troponin - HS troponin 22>27 - no chest pain reported - continue to trend troponin - suspect mostly demand ischemia, but cannot rule out CAD - continue statin - may need stress test eventually. Not a good candidate for Cath/cardiac CTA given AKI/CKD.   HTN - still elevated - Hydralazine 50mg  TID - metoprolol 50mg  BID - PTA valsartan and amlodipine held - increase Imdur to 60mg  daily   DM2 - A1C 7.1   AKI on CKD stage 3 - scr 3.33,  BUN 31 on admission - Scr was 1.28 eight months ago - PTA Valsartan and empaglifozin held - Scr 3.4 >3.68 - protein to creatine ratio 13g - nephrology consulted, nephrotic syndrome suspected 2/2 diabetes. Plan for kidney biopsy   Hypokalemia - supplement as needed   HLD - 132 in 03/2022 - start statin  For questions or updates, please contact [facility] Please consult [email or web address] for contact info under        Signed, [place]avitt David Stall,  PA-C  07/03/2023, 9:07 AM

## 2023-07-03 NOTE — Progress Notes (Signed)
[facility] Progress Note  Patient: Shannon Cox    WGN:[id number]  DOA: 06/30/2023     Date of Service: the patient was seen and examined on 07/03/2023  Chief Complaint  Patient presents with   Shortness of Breath   Brief hospital course: Shannon Cox is a 52 y.o. 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, ambulant with cane, CKD 3a with anemia of CKD, morbid obesity, recurrent pancreatitis, hospitalized  at [facility] in June 2024 for nausea vomiting and diarrhea, discharged to outpatient GI for upper and lower endoscopy, and who during that admission was found to have lower extremity edema, placed on Lasix and discharged with outpatient follow-up to nephrology to evaluate for nephrotic syndrome, who presents to the ED with a complaint of shortness of breath that acutely worsened on the evening of presentation while lying flat, improved somewhat by sitting upright.  She had an upper endoscopy the day prior at [facility] that showed gastritis, biopsies pending.  She reports that her lower extremity edema has been worsening for the past week.   ED course and data review: BP in the ED in the 170s to 180s with pulse in the 90s and otherwise normal vitals.  O2 sats in the high 90s on room air. Labs: Notable for troponin 27 and BNP 250.  WBC normal and hemoglobin 9.6.  Potassium 3.4.  Creatinine 3.33 up from baseline of 1.6.  EKG, personally viewed and interpreted showing NSR at 95 with no acute ST-T wave changes. Chest x-ray shows cardiomegaly with no active cardiopulmonary disease Patient treated with IV Lasix 40 mg and hospitalist consulted for admission.   Assessment and Plan:  Nephrotic syndrome Acute renal failure superimposed on stage 3b chronic kidney disease Anemia of CKD 3b Baseline creatinine 1.6 Held Lasix, valsartan and Jardiance Home meds on admission Patient had an outpatient   nephrology referral for workup for nephrotic syndrome(had SPEP UPEP done while at [facility]) Nephrology consulted Urinalysis positive for UTI, urine protein> 3000, nephrotic range Nephrology consulted, recommended renal biopsy which cannot be done because patient was on aspirin which has been discontinued.  Renal biopsy can be done most likely on Monday or Tuesday Patient does not take aspirin at home, she was given only 1 dose on 9/26   Acute heart failure  Elevated troponin, suspect demand ischemia History of small pericardial effusion June 2024 Hypertensive urgency/emergency Echo 03/18/2023 showed EF>55% and small pericardial effusion without hemodynamic compromise Given cardiomegaly on chest x-ray will repeat echo to evaluate size of pericardial effusion S/p IV Lasix, Hold ACE/ARB's for now.   Hold amlodipine for possible contribution to lower extremity edema Increased hydralazine 50 mg p.o. 3 times daily, started Imdur 60 mg p.o. daily and metoprolol 75 mg p.o. twice daily S/p Lasix IV infusion d/c'd on 9/27 Cardiology consult appreciated, recommended continue current treatment and signed off.    UTI, UA positive, urine culture growing E. coli, sensitive to ceftriaxone, resistant to ampicillin only. Continue ceftriaxone 1 g IV daily for 5 days  Hypokalemia, potassium repleted cautiously due to CKD 3.  Hypertensive urgency, uncontrolled blood pressure Increase hydralazine 50 mg p.o. 3 times daily 9/28 increased metoprolol 75 mg p.o. twice daily  9/28 increased Imdur 60 mg p.o. daily S/p Lasix IV infusion, DC'd due to worsening of renal functions Monitor BP and titrate medications accordingly   OSA (obstructive sleep apnea) Suspected OSA Patient with recently did a home sleep study  and is awaiting the results   Diabetic neuropathy (HCC) Multimodal pain control.  Avoid NSAIDs   Obesity, Class III, BMI 40-49.9 (morbid obesity) (HCC) Complicating factor to overall prognosis and  care   Insulin dependent type 2 diabetes mellitus.  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 Follow-up with PCP to repeat folic acid level after 3 to 6 months.  Vitamin B12 level 194, goal >400, started vitamin B12 1000 mcg IM injection daily during hospital stay followed by oral supplement. Follow-up with PCP to repeat vitamin B12 level after 3 to 6 months.  Vitamin D insufficiency, started vitamin D 50,000 units p.o. weekly.  Follow-up with PCP to repeat vitamin D level after 3 to 6 months  Body mass index is 42.27 kg/m.  Interventions:  Diet: Carb modified/heart healthy diet with fluid striction 1.5 L/day DVT Prophylaxis: Subcutaneous Lovenox   Advance goals of care discussion: Full code  Family Communication: family was not present at bedside, at the time of interview.  The pt provided permission to discuss medical plan with the family. Opportunity was given to ask question and all questions were answered satisfactorily.   Disposition:  Pt is from Home, admitted with volume overload, AKI, nephrotic syndrome, needs renal biopsy, still has elevated creatinine, which precludes a safe discharge. Discharge to Home with HH TBD after PT and OT eval, when stable, may need few days to stay in the hospital..  Subjective: No significant events overnight, patient was awake and alert today, still has significant lower extremity edema, no any other complaints.  Physical Exam: General: NAD, lying comfortably Appear in no distress, affect appropriate Eyes: PERRLA ENT: Oral Mucosa Clear, moist  Neck: no JVD,  Cardiovascular: S1 and S2 Present, no Murmur,  Respiratory: good respiratory effort, Bilateral Air entry equal and Decreased, no Crackles, no wheezes Abdomen: Bowel Sound  present, Soft and no tenderness,  Skin: no rashes Extremities: 4+ pedal edema, no calf tenderness, chronic right foot drop Neurologic: without any new focal findings Gait not checked due to patient safety concerns  Vitals:   07/02/23 2347 07/03/23 0459 07/03/23 0844 07/03/23 1121  BP: (!) 147/69 (!) 152/70 (!) 167/66 (!) 159/79  Pulse: 82 80 93 85  Resp: 18 18 17 16   Temp: 98.3 F (36.8 C) 98.3 F (36.8 C) 98.4 F (36.9 C) 98 F (36.7 C)  TempSrc:      SpO2: 96% 100% 99% 100%  Weight:      Height:        Intake/Output Summary (Last 24 hours) at 07/03/2023 1300 Last data filed at 07/03/2023 0400 Gross per 24 hour  Intake 867.72 ml  Output --  Net 867.72 ml   Filed Weights   06/30/23 1800  Weight: 115.2 kg    Data Reviewed: I have personally reviewed and interpreted daily labs, tele strips, imagings as discussed above. I reviewed all nursing notes, pharmacy notes, vitals, pertinent old records I have discussed plan of care as described above with RN and patient/family.  CBC: Recent Labs  Lab 06/30/23 1808 07/01/23 0432 07/02/23 0252 07/03/23 0639  WBC 7.2 9.1 6.4 7.3  HGB 9.8* 10.1* 9.4* 9.0*  HCT 32.3* 32.5* 30.6* 28.7*  MCV 77.5* 76.3* 75.9* 74.7*  PLT 331 315 317 314   Basic Metabolic Panel: Recent Labs  Lab 06/30/23 1808 07/01/23  7253 07/01/23 1539 07/02/23 0252 07/03/23 0639  NA 138 138 139 138 140  K 3.4* 3.0* 4.1 4.3 4.8  CL 108 109 107 106 109  CO2 21* 23 22 24 23   GLUCOSE 116* 127* 140* 142* 121*  BUN 31* 33* 35* 40* 48*  CREATININE 3.33* 3.23* 3.14* 3.40* 3.68*  CALCIUM 8.3* 8.3* 8.1* 8.2* 8.5*  MG  --  1.8  --  1.8 2.0  PHOS  --  4.2  --  4.8* 5.0*    Studies: No results found.  Scheduled Meds:  cyanocobalamin  1,000 mcg Intramuscular Q1200   Followed by   Melene Muller ON 07/09/2023] vitamin B-12  1,000 mcg Oral Daily   enoxaparin (LOVENOX) injection  30 mg Subcutaneous Q24H   feeding supplement (GLUCERNA SHAKE)  237 mL Oral TID BM    folic acid  1 mg Oral Daily   hydrALAZINE  50 mg Oral Q8H   insulin aspart  0-20 Units Subcutaneous TID WC   insulin aspart  0-5 Units Subcutaneous QHS   insulin glargine-yfgn  20 Units Subcutaneous QHS   isosorbide mononitrate  60 mg Oral Daily   metoprolol tartrate  50 mg Oral BID   pantoprazole  20 mg Oral Daily   potassium chloride  20 mEq Oral Daily   rosuvastatin  20 mg Oral Daily   Vitamin D (Ergocalciferol)  50,000 Units Oral Q7 days   Continuous Infusions:  cefTRIAXone (ROCEPHIN)  IV 1 g (07/02/23 2133)   furosemide (LASIX) 200 mg in dextrose 5 % 100 mL (2 mg/mL) infusion 4 mg/hr (07/01/23 1118)   PRN Meds: acetaminophen **OR** acetaminophen, hydrALAZINE, ondansetron **OR** ondansetron (ZOFRAN) IV  Time spent: 35 minutes  Author: Gillis Santa. MD [facility] 07/03/2023 1:00 PM  To reach On-call, see care teams to locate the attending and reach out to them via [email or web address]. If 7PM-7AM, please contact night-coverage If you still have difficulty reaching the attending provider, please page the [facility] (Director on Call) for [facility] on amion for assistance.

## 2023-07-04 DIAGNOSIS — N1832 Chronic kidney disease, stage 3b: Secondary | ICD-10-CM | POA: Diagnosis not present

## 2023-07-04 DIAGNOSIS — N170 Acute kidney failure with tubular necrosis: Secondary | ICD-10-CM | POA: Diagnosis not present

## 2023-07-04 LAB — GLUCOSE, CAPILLARY
Glucose-Capillary: 70 mg/dL (ref 70–99)
Glucose-Capillary: 117 mg/dL — ABNORMAL HIGH (ref 70–99)
Glucose-Capillary: 91 mg/dL (ref 70–99)
Glucose-Capillary: 120 mg/dL — ABNORMAL HIGH (ref 70–99)

## 2023-07-04 LAB — CBC
HCT: 26.3 % — ABNORMAL LOW (ref 36.0–46.0)
Hemoglobin: 8.3 g/dL — ABNORMAL LOW (ref 12.0–15.0)
MCH: 23.4 pg — ABNORMAL LOW (ref 26.0–34.0)
MCHC: 31.6 g/dL (ref 30.0–36.0)
MCV: 74.1 fL — ABNORMAL LOW (ref 80.0–100.0)
Platelets: 298 10*3/uL (ref 150–400)
RBC: 3.55 MIL/uL — ABNORMAL LOW (ref 3.87–5.11)
RDW: 20.8 % — ABNORMAL HIGH (ref 11.5–15.5)
WBC: 7.4 10*3/uL (ref 4.0–10.5)
nRBC: 0 % (ref 0.0–0.2)

## 2023-07-04 LAB — BASIC METABOLIC PANEL
Anion gap: 8 (ref 5–15)
BUN: 54 mg/dL — ABNORMAL HIGH (ref 6–20)
CO2: 22 mmol/L (ref 22–32)
Calcium: 8.4 mg/dL — ABNORMAL LOW (ref 8.9–10.3)
Chloride: 108 mmol/L (ref 98–111)
Creatinine, Ser: 3.83 mg/dL — ABNORMAL HIGH (ref 0.44–1.00)
GFR, Estimated: 14 mL/min — ABNORMAL LOW (ref >60)
Glucose, Bld: 73 mg/dL (ref 70–99)
Potassium: 4.7 mmol/L (ref 3.5–5.1)
Sodium: 138 mmol/L (ref 135–145)

## 2023-07-04 LAB — PROTIME-INR
INR: 1.1 (ref 0.8–1.2)
Prothrombin Time: 14.3 s (ref 11.4–15.2)

## 2023-07-04 LAB — MAGNESIUM: Magnesium: 2.1 mg/dL (ref 1.7–2.4)

## 2023-07-04 LAB — PHOSPHORUS: Phosphorus: 5.1 mg/dL — ABNORMAL HIGH (ref 2.5–4.6)

## 2023-07-04 MED ORDER — CEPHALEXIN 500 MG PO CAPS
500.0000 mg | ORAL_CAPSULE | Freq: Two times a day (BID) | ORAL | Status: AC
  Administered 2023-07-04 – 2023-07-07 (×6): 500 mg via ORAL
  Filled 2023-07-04 (×6): qty 1

## 2023-07-04 MED ORDER — ALUM & MAG HYDROXIDE-SIMETH 200-200-20 MG/5ML PO SUSP
30.0000 mL | ORAL | Status: DC | PRN
  Administered 2023-07-04: 30 mL via ORAL
  Filled 2023-07-04: qty 30

## 2023-07-04 NOTE — Progress Notes (Signed)
[facility]  ROUNDING NOTE   Subjective:   Patient remains volume overloaded though improved.  We are planning for renal biopsy tomorrow.   Objective:  Vital signs in last 24 hours:  Temp:  [97.8 F (36.6 C)-99 F (37.2 C)] 98.5 F (36.9 C) (09/29 1132) Pulse Rate:  [80-93] 80 (09/29 1132) Resp:  [16-18] 16 (09/29 1132) BP: (117-165)/(53-91) 140/67 (09/29 1132) SpO2:  [96 %-100 %] 100 % (09/29 1132) Weight:  [120.8 kg] 120.8 kg (09/29 0837)  Weight change:  Filed Weights   06/30/23 1800 07/04/23 0837  Weight: 115.2 kg 120.8 kg    Intake/Output: I/O last 3 completed shifts: In: 530.7 [P.O.:240; I.V.:90.7; IV Piggyback:200] Out: -    Intake/Output this shift:  No intake/output data recorded.  Physical Exam: General: NAD  Head: Normocephalic, atraumatic. Moist oral mucosal membranes  Eyes: Anicteric  Lungs:  Clear to auscultation, normal effort  Heart: Regular rate and rhythm  Abdomen:  Soft, nontender  Extremities:  3+ peripheral edema.  Neurologic: Alert and oriented, moving all four extremities  Skin: No lesions  Access: None    Basic Metabolic Panel: Recent Labs  Lab 07/01/23 0432 07/01/23 1539 07/02/23 0252 07/03/23 0639 07/04/23 0456  NA 138 139 138 140 138  K 3.0* 4.1 4.3 4.8 4.7  CL 109 107 106 109 108  CO2 23 22 24 23 22   GLUCOSE 127* 140* 142* 121* 73  BUN 33* 35* 40* 48* 54*  CREATININE 3.23* 3.14* 3.40* 3.68* 3.83*  CALCIUM 8.3* 8.1* 8.2* 8.5* 8.4*  MG 1.8  --  1.8 2.0 2.1  PHOS 4.2  --  4.8* 5.0* 5.1*    Liver Function Tests: Recent Labs  Lab 06/30/23 2228 07/01/23 0432  AST 13* 11*  ALT 11 10  ALKPHOS 75 76  BILITOT 0.6 0.2*  PROT 6.4* 6.2*  ALBUMIN 2.2* 2.1*   No results for input(s): "LIPASE", "AMYLASE" in the last 168 hours. No results for input(s): "AMMONIA" in the last 168 hours.  CBC: Recent Labs  Lab 06/30/23 1808 07/01/23 0432 07/02/23 0252 07/03/23 0639 07/04/23 0456  WBC 7.2 9.1 6.4 7.3 7.4   HGB 9.8* 10.1* 9.4* 9.0* 8.3*  HCT 32.3* 32.5* 30.6* 28.7* 26.3*  MCV 77.5* 76.3* 75.9* 74.7* 74.1*  PLT 331 315 317 314 298    Cardiac Enzymes: No results for input(s): "CKTOTAL", "CKMB", "CKMBINDEX", "TROPONINI" in the last 168 hours.  BNP: Invalid input(s): "POCBNP"  CBG: Recent Labs  Lab 07/03/23 1216 07/03/23 1637 07/03/23 2032 07/04/23 0748 07/04/23 1131  GLUCAP 95 102* 97 70 117*    Microbiology: Results for orders placed or performed during the hospital encounter of 06/30/23  Urine Culture (for pregnant, neutropenic or urologic patients or patients with an indwelling urinary catheter)     Status: Abnormal   Collection Time: 07/01/23  4:32 AM   Specimen: Urine, Clean Catch  Result Value Ref Range Status   Specimen Description   Final    URINE, CLEAN CATCH Performed at [facility], [address] [place], [place] [address]    Special Requests   Final    NONE Performed at [facility], [address] [place], [place] [address]    Culture >=100,000 COLONIES/mL ESCHERICHIA COLI (A)  Final   Report Status 07/03/2023 FINAL  Final   Organism ID, Bacteria ESCHERICHIA COLI (A)  Final      Susceptibility   Escherichia coli - MIC*    AMPICILLIN >=32 RESISTANT Resistant  CEFAZOLIN <=4 SENSITIVE Sensitive     CEFEPIME <=0.12 SENSITIVE Sensitive     CEFTRIAXONE <=0.25 SENSITIVE Sensitive     CIPROFLOXACIN <=0.25 SENSITIVE Sensitive     GENTAMICIN <=1 SENSITIVE Sensitive     IMIPENEM <=0.25 SENSITIVE Sensitive     NITROFURANTOIN <=16 SENSITIVE Sensitive     TRIMETH/SULFA <=20 SENSITIVE Sensitive     AMPICILLIN/SULBACTAM 4 SENSITIVE Sensitive     PIP/TAZO <=4 SENSITIVE Sensitive     * >=100,000 COLONIES/mL ESCHERICHIA COLI    Coagulation Studies: No results for input(s): "LABPROT", "INR" in the last 72 hours.  Urinalysis: No results for input(s): "COLORURINE", "LABSPEC", "PHURINE", "GLUCOSEU", "HGBUR", "BILIRUBINUR", "KETONESUR",  "PROTEINUR", "UROBILINOGEN", "NITRITE", "LEUKOCYTESUR" in the last 72 hours.  Invalid input(s): "APPERANCEUR"     Imaging: No results found.   Medications:    furosemide (LASIX) 200 mg in dextrose 5 % 100 mL (2 mg/mL) infusion 4 mg/hr (07/03/23 1557)    cephALEXin  500 mg Oral Q12H   cyanocobalamin  1,000 mcg Intramuscular Q1200   Followed by   Melene Muller ON 07/09/2023] vitamin B-12  1,000 mcg Oral Daily   enoxaparin (LOVENOX) injection  30 mg Subcutaneous Q24H   feeding supplement (GLUCERNA SHAKE)  237 mL Oral TID BM   folic acid  1 mg Oral Daily   hydrALAZINE  50 mg Oral Q8H   insulin aspart  0-20 Units Subcutaneous TID WC   insulin aspart  0-5 Units Subcutaneous QHS   insulin glargine-yfgn  20 Units Subcutaneous QHS   isosorbide mononitrate  60 mg Oral Daily   metoprolol tartrate  75 mg Oral BID   pantoprazole  20 mg Oral Daily   rosuvastatin  20 mg Oral Daily   Vitamin D (Ergocalciferol)  50,000 Units Oral Q7 days   acetaminophen **OR** acetaminophen, alum & mag hydroxide-simeth, hydrALAZINE, ondansetron **OR** ondansetron (ZOFRAN) IV  Assessment/ Plan:  Ms. Shannon Cox is a 52 y.o.  female with medical problems of hypertension, insulin-dependent diabetes with neuropathy and retinopathy, foot drop, chronic kidney disease, anemia, morbid obesity, recurrent pancreatitis, was admitted on 06/30/2023 for Shortness of breath [R06.02] Acute CHF (HCC) [I50.9] AKI (acute kidney injury) (HCC) [N17.9]   Acute kidney injury on chronic kidney disease stage IIIb. Underlying CKD risk factors include poorly controlled diabetes, hypertension, obesity, use of nonsteroidals. Baseline creatinine of 1.85, GFR 33 from 04/14/2023.  Urine protein to creatinine ratio of 13 g. Empagliflozin, valsartan on hold.  Renal function slightly worse as patient remains on Lasix drip.  Given ongoing significant proteinuria we will plan for renal biopsy tomorrow if possible.  Lab Results  Component  Value Date   CREATININE 3.83 (H) 07/04/2023   CREATININE 3.68 (H) 07/03/2023   CREATININE 3.40 (H) 07/02/2023    Intake/Output Summary (Last 24 hours) at 07/04/2023 1425 Last data filed at 07/04/2023 0500 Gross per 24 hour  Intake 382.93 ml  Output --  Net 382.93 ml   2. Diabetes type 2 with CKD Most recent hemoglobin A1c 7.1%. Current regimen includes insulin Semglee  Glucose well controlled   3. Nephrotic proteinuria Urine protein to creatinine ratio of 13 g as outpatient. Serologies negative.  Most likely it is related to diabetes. We are planning for renal biopsy tomorrow.  Will need anxiety medication prior to procedure.   4. Hypertension with lower extremity edema Blood pressure currently remains under good control.  Continue hydralazine and metoprolol for now.    LOS: 3 Rosilyn Coachman 9/29/20242:25 PM

## 2023-07-04 NOTE — Progress Notes (Signed)
Physical Therapy Treatment Patient Details Name: Shannon Cox MRN: [id number] DOB: [birth year] Today's Date: 07/04/2023   History of Present Illness Pt is a 52 y.o. 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, ambulant with cane, CKD 3a with anemia of CKD, morbid obesity, recurrent pancreatitis. Pt presents to the ED with a complaint of shortness of breath that acutely worsened on the evening of presentation while lying flat, improved somewhat by sitting upright.    PT Comments  Pt seen for PT tx with pt agreeable. Pt is able to increase ambulation distances on this date but with impaired gait pattern as noted below. PT provided education re: how to increase safety with mobility. Pt reports she's "very tired" after gait. Will continue to follow pt acutely to progress mobility as able.    If plan is discharge home, recommend the following: A little help with walking and/or transfers;A little help with bathing/dressing/bathroom;Assist for transportation;Help with stairs or ramp for entrance   Can travel by private vehicle        Equipment Recommendations  Rolling walker (2 wheels)    Recommendations for Other Services       Precautions / Restrictions Precautions Precautions: Fall Precaution Comments: chronic LBP Restrictions Weight Bearing Restrictions: No     Mobility  Bed Mobility               General bed mobility comments: not tested, pt received & left sitting EOB    Transfers Overall transfer level: Needs assistance Equipment used: Rolling walker (2 wheels) Transfers: Sit to/from Stand Sit to Stand: Supervision           General transfer comment: STS from EOB with RW    Ambulation/Gait Ambulation/Gait assistance: Supervision Gait Distance (Feet): 150 Feet Assistive device: Rolling walker (2 wheels) Gait Pattern/deviations: Shuffle, Decreased step length -  right, Decreased step length - left, Decreased dorsiflexion - right, Decreased dorsiflexion - left, Decreased stride length Gait velocity: decreased     General Gait Details: Pt slides feet along floor vs lifting to step, reports this is baseline for her. Pt with lean onto BUE on RW throughout gait, 1 standing rest break 2/2 fatigue. Pt reports she ambulates short distances at home by holding on to furniture with PT educating her on recommendation to use RW vs furniture walking. Pt also ambulating in slide on slippers that slid off one time with pt requiring CGA to correct LOB. PT educated pt on need to wear non skid socks or shoes that won't slide off & pt agreeable.   Stairs             Wheelchair Mobility     Tilt Bed    Modified Rankin (Stroke Patients Only)       Balance Overall balance assessment: Needs assistance Sitting-balance support: Feet supported Sitting balance-Leahy Scale: Normal     Standing balance support: Bilateral upper extremity supported, During functional activity, Reliant on assistive device for balance Standing balance-Leahy Scale: Fair                              Cognition Arousal: Alert Behavior During Therapy: WFL for tasks assessed/performed Overall Cognitive Status: Within Functional Limits for tasks assessed  Exercises      General Comments        Pertinent Vitals/Pain Pain Assessment Pain Assessment: 0-10 Pain Score: 8  Pain Location: low back Pain Descriptors / Indicators: Discomfort Pain Intervention(s): Monitored during session, Limited activity within patient's tolerance    Home Living                          Prior Function            PT Goals (current goals can now be found in the care plan section) Acute Rehab PT Goals Patient Stated Goal: to go home PT Goal Formulation: With patient Time For Goal Achievement: 07/16/23 Potential to  Achieve Goals: Good Progress towards PT goals: Progressing toward goals    Frequency    Min 1X/week      PT Plan      Co-evaluation              AM-PAC PT "6 Clicks" Mobility   Outcome Measure  Help needed turning from your back to your side while in a flat bed without using bedrails?: A Little Help needed moving from lying on your back to sitting on the side of a flat bed without using bedrails?: A Little Help needed moving to and from a bed to a chair (including a wheelchair)?: A Little Help needed standing up from a chair using your arms (e.g., wheelchair or bedside chair)?: A Little Help needed to walk in hospital room?: A Little Help needed climbing 3-5 steps with a railing? : A Little 6 Click Score: 18    End of Session   Activity Tolerance: Patient limited by fatigue;Patient limited by pain Patient left: in bed;with call bell/phone within reach Nurse Communication: Mobility status PT Visit Diagnosis: Unsteadiness on feet (R26.81);Repeated falls (R29.6);History of falling (Z91.81);Muscle weakness (generalized) (M62.81);Pain Pain - part of body:  (low back)     Time: 8756-4332 PT Time Calculation (min) (ACUTE ONLY): 13 min  Charges:    $Therapeutic Activity: 8-22 mins PT General Charges $$ ACUTE PT VISIT: 1 Visit                     Aleda Grana, PT, DPT 07/04/23, 10:29 AM   Sandi Mariscal 07/04/2023, 10:29 AM

## 2023-07-04 NOTE — Progress Notes (Signed)
[facility] Progress Note  Patient: Shannon Cox    UEA:[id number]  DOA: 06/30/2023     Date of Service: the patient was seen and examined on 07/04/2023  Chief Complaint  Patient presents with   Shortness of Breath   Brief hospital course: Shannon Cox is a 52 y.o. 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, ambulant with cane, CKD 3a with anemia of CKD, morbid obesity, recurrent pancreatitis, hospitalized  at [facility] in June 2024 for nausea vomiting and diarrhea, discharged to outpatient GI for upper and lower endoscopy, and who during that admission was found to have lower extremity edema, placed on Lasix and discharged with outpatient follow-up to nephrology to evaluate for nephrotic syndrome, who presents to the ED with a complaint of shortness of breath that acutely worsened on the evening of presentation while lying flat, improved somewhat by sitting upright.  She had an upper endoscopy the day prior at [facility] that showed gastritis, biopsies pending.  She reports that her lower extremity edema has been worsening for the past week.   ED course and data review: BP in the ED in the 170s to 180s with pulse in the 90s and otherwise normal vitals.  O2 sats in the high 90s on room air. Labs: Notable for troponin 27 and BNP 250.  WBC normal and hemoglobin 9.6.  Potassium 3.4.  Creatinine 3.33 up from baseline of 1.6.  EKG, personally viewed and interpreted showing NSR at 95 with no acute ST-T wave changes. Chest x-ray shows cardiomegaly with no active cardiopulmonary disease Patient treated with IV Lasix 40 mg and hospitalist consulted for admission.   Assessment and Plan:  Nephrotic syndrome Acute renal failure superimposed on stage 3b chronic kidney disease Anemia of CKD 3b Baseline creatinine 1.6 Held Lasix, valsartan and Jardiance Home meds on admission Patient had an outpatient   nephrology referral for workup for nephrotic syndrome(had SPEP UPEP done while at [facility]) Nephrology consulted Urinalysis positive for UTI, urine protein> 3000, nephrotic range Nephrology consulted, recommended renal biopsy which cannot be done because patient was on aspirin which has been discontinued.  Renal biopsy can be done most likely on Monday or Tuesday Patient does not take aspirin at home, she was given only 1 dose on 9/26 Follow nephrology regarding IV diuresis and renal biopsy. Creatinine 3.83, gradually getting worse, nephrology is aware  Acute heart failure  Elevated troponin, suspect demand ischemia History of small pericardial effusion June 2024 Hypertensive urgency/emergency Echo 03/18/2023 showed EF>55% and small pericardial effusion without hemodynamic compromise Given cardiomegaly on chest x-ray will repeat echo to evaluate size of pericardial effusion S/p IV Lasix, Hold ACE/ARB's for now.   Hold amlodipine for possible contribution to lower extremity edema Increased hydralazine 50 mg p.o. 3 times daily, started Imdur 60 mg p.o. daily and metoprolol 75 mg p.o. twice daily Continue Lasix IV infusion  Cardiology consult appreciated, recommended continue current treatment and signed off.    UTI, UA positive, urine culture growing E. coli, sensitive to ceftriaxone, resistant to ampicillin only. S/p ceftriaxone 1 g IV daily, transition to Keflex 500 mg p.o. twice daily for 3 days to complete 5-day course.  Hypokalemia, potassium repleted cautiously due to CKD 3.  Resolved  Hypertensive urgency, uncontrolled blood pressure Increase hydralazine 50 mg p.o. 3 times daily 9/28 increased metoprolol 75 mg p.o. twice daily  9/28 increased Imdur 60 mg p.o. daily S/p Lasix IV infusion, DC'd due  to worsening of renal functions Monitor BP and titrate medications accordingly   OSA (obstructive sleep apnea) Suspected OSA Patient with recently did a home sleep study and is awaiting  the results   Diabetic neuropathy (HCC) Multimodal pain control.  Avoid NSAIDs   Obesity, Class III, BMI 40-49.9 (morbid obesity) (HCC) Complicating factor to overall prognosis and care   Insulin dependent type 2 diabetes mellitus.  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 Follow-up with PCP to repeat folic acid level after 3 to 6 months.  Vitamin B12 level 194, goal >400, started vitamin B12 1000 mcg IM injection daily during hospital stay followed by oral supplement. Follow-up with PCP to repeat vitamin B12 level after 3 to 6 months.  Vitamin D insufficiency, started vitamin D 50,000 units p.o. weekly.  Follow-up with PCP to repeat vitamin D level after 3 to 6 months  Body mass index is 44.32 kg/m.  Interventions:  Diet: Carb modified/heart healthy diet with fluid striction 1.5 L/day DVT Prophylaxis: Subcutaneous Lovenox   Advance goals of care discussion: Full code  Family Communication: family was not present at bedside, at the time of interview.  The pt provided permission to discuss medical plan with the family. Opportunity was given to ask question and all questions were answered satisfactorily.   Disposition:  Pt is from Home, admitted with volume overload, AKI, nephrotic syndrome, needs renal biopsy, still has elevated creatinine, which precludes a safe discharge. Discharge to Home with HH TBD after PT and OT eval, when stable, may need few days to stay in the hospital..  Subjective: No significant events overnight, patient was sitting at the edge of bed, communicating with somebody over the phone.  Denies any complaints.  Still has significant lower extremity edema. Patient is willing for renal biopsy.  Physical Exam: General: NAD, lying comfortably Appear in  no distress, affect appropriate Eyes: PERRLA ENT: Oral Mucosa Clear, moist  Neck: no JVD,  Cardiovascular: S1 and S2 Present, no Murmur,  Respiratory: good respiratory effort, Bilateral Air entry equal and Decreased, no Crackles, no wheezes Abdomen: Bowel Sound present, Soft and no tenderness,  Skin: no rashes Extremities: 4+ pedal edema, no calf tenderness, chronic right foot drop Neurologic: without any new focal findings Gait not checked due to patient safety concerns  Vitals:   07/04/23 0406 07/04/23 0819 07/04/23 0837 07/04/23 1132  BP: (!) 165/83 (!) 158/70  (!) 140/67  Pulse: 86 81  80  Resp: 18 16  16   Temp: 99 F (37.2 C) 97.8 F (36.6 C)  98.5 F (36.9 C)  TempSrc:    Oral  SpO2: 97% 100%  100%  Weight:   120.8 kg   Height:        Intake/Output Summary (Last 24 hours) at 07/04/2023 1414 Last data filed at 07/04/2023 0500 Gross per 24 hour  Intake 382.93 ml  Output --  Net 382.93 ml   Filed Weights   06/30/23 1800 07/04/23 0837  Weight: 115.2 kg 120.8 kg    Data Reviewed: I have personally reviewed and interpreted daily labs, tele strips, imagings as discussed above. I reviewed all nursing notes, pharmacy notes, vitals, pertinent old records I have discussed plan of care as described above with RN and patient/family.  CBC: Recent Labs  Lab 06/30/23 1808 07/01/23 0432 07/02/23 0252 07/03/23  1610 07/04/23 0456  WBC 7.2 9.1 6.4 7.3 7.4  HGB 9.8* 10.1* 9.4* 9.0* 8.3*  HCT 32.3* 32.5* 30.6* 28.7* 26.3*  MCV 77.5* 76.3* 75.9* 74.7* 74.1*  PLT 331 315 317 314 298   Basic Metabolic Panel: Recent Labs  Lab 07/01/23 0432 07/01/23 1539 07/02/23 0252 07/03/23 0639 07/04/23 0456  NA 138 139 138 140 138  K 3.0* 4.1 4.3 4.8 4.7  CL 109 107 106 109 108  CO2 23 22 24 23 22   GLUCOSE 127* 140* 142* 121* 73  BUN 33* 35* 40* 48* 54*  CREATININE 3.23* 3.14* 3.40* 3.68* 3.83*  CALCIUM 8.3* 8.1* 8.2* 8.5* 8.4*  MG 1.8  --  1.8 2.0 2.1  PHOS 4.2  --  4.8* 5.0*  5.1*    Studies: No results found.  Scheduled Meds:  cephALEXin  500 mg Oral Q12H   cyanocobalamin  1,000 mcg Intramuscular Q1200   Followed by   Melene Muller ON 07/09/2023] vitamin B-12  1,000 mcg Oral Daily   enoxaparin (LOVENOX) injection  30 mg Subcutaneous Q24H   feeding supplement (GLUCERNA SHAKE)  237 mL Oral TID BM   folic acid  1 mg Oral Daily   hydrALAZINE  50 mg Oral Q8H   insulin aspart  0-20 Units Subcutaneous TID WC   insulin aspart  0-5 Units Subcutaneous QHS   insulin glargine-yfgn  20 Units Subcutaneous QHS   isosorbide mononitrate  60 mg Oral Daily   metoprolol tartrate  75 mg Oral BID   pantoprazole  20 mg Oral Daily   rosuvastatin  20 mg Oral Daily   Vitamin D (Ergocalciferol)  50,000 Units Oral Q7 days   Continuous Infusions:  furosemide (LASIX) 200 mg in dextrose 5 % 100 mL (2 mg/mL) infusion 4 mg/hr (07/03/23 1557)   PRN Meds: acetaminophen **OR** acetaminophen, alum & mag hydroxide-simeth, hydrALAZINE, ondansetron **OR** ondansetron (ZOFRAN) IV  Time spent: 35 minutes  Author: Gillis Santa. MD [facility] 07/04/2023 2:14 PM  To reach On-call, see care teams to locate the attending and reach out to them via [email or web address]. If 7PM-7AM, please contact night-coverage If you still have difficulty reaching the attending provider, please page the [facility] (Director on Call) for [facility] on amion for assistance.

## 2023-07-05 DIAGNOSIS — N1832 Chronic kidney disease, stage 3b: Secondary | ICD-10-CM | POA: Diagnosis not present

## 2023-07-05 DIAGNOSIS — N170 Acute kidney failure with tubular necrosis: Secondary | ICD-10-CM | POA: Diagnosis not present

## 2023-07-05 LAB — BASIC METABOLIC PANEL
Anion gap: 9 (ref 5–15)
BUN: 60 mg/dL — ABNORMAL HIGH (ref 6–20)
CO2: 22 mmol/L (ref 22–32)
Calcium: 8.4 mg/dL — ABNORMAL LOW (ref 8.9–10.3)
Chloride: 107 mmol/L (ref 98–111)
Creatinine, Ser: 3.65 mg/dL — ABNORMAL HIGH (ref 0.44–1.00)
GFR, Estimated: 14 mL/min — ABNORMAL LOW (ref >60)
Glucose, Bld: 99 mg/dL (ref 70–99)
Potassium: 4.7 mmol/L (ref 3.5–5.1)
Sodium: 138 mmol/L (ref 135–145)

## 2023-07-05 LAB — CBC WITH DIFFERENTIAL/PLATELET
Abs Immature Granulocytes: 0.03 10*3/uL (ref 0.00–0.07)
Basophils Absolute: 0 10*3/uL (ref 0.0–0.1)
Basophils Relative: 0 %
Eosinophils Absolute: 0.2 10*3/uL (ref 0.0–0.5)
Eosinophils Relative: 3 %
HCT: 26 % — ABNORMAL LOW (ref 36.0–46.0)
Hemoglobin: 8.3 g/dL — ABNORMAL LOW (ref 12.0–15.0)
Immature Granulocytes: 0 %
Lymphocytes Relative: 24 %
Lymphs Abs: 1.7 10*3/uL (ref 0.7–4.0)
MCH: 23.4 pg — ABNORMAL LOW (ref 26.0–34.0)
MCHC: 31.9 g/dL (ref 30.0–36.0)
MCV: 73.4 fL — ABNORMAL LOW (ref 80.0–100.0)
Monocytes Absolute: 0.8 10*3/uL (ref 0.1–1.0)
Monocytes Relative: 11 %
Neutro Abs: 4.3 10*3/uL (ref 1.7–7.7)
Neutrophils Relative %: 62 %
Platelets: 315 10*3/uL (ref 150–400)
RBC: 3.54 MIL/uL — ABNORMAL LOW (ref 3.87–5.11)
RDW: 20.3 % — ABNORMAL HIGH (ref 11.5–15.5)
WBC: 7 10*3/uL (ref 4.0–10.5)
nRBC: 0 % (ref 0.0–0.2)

## 2023-07-05 LAB — HEPATIC FUNCTION PANEL
ALT: 11 U/L (ref 0–44)
AST: 12 U/L — ABNORMAL LOW (ref 15–41)
Albumin: 2.1 g/dL — ABNORMAL LOW (ref 3.5–5.0)
Alkaline Phosphatase: 68 U/L (ref 38–126)
Bilirubin, Direct: <0.1 mg/dL (ref 0.0–0.2)
Total Bilirubin: 0.4 mg/dL (ref 0.3–1.2)
Total Protein: 5.6 g/dL — ABNORMAL LOW (ref 6.5–8.1)

## 2023-07-05 LAB — GLUCOSE, CAPILLARY
Glucose-Capillary: 76 mg/dL (ref 70–99)
Glucose-Capillary: 133 mg/dL — ABNORMAL HIGH (ref 70–99)
Glucose-Capillary: 129 mg/dL — ABNORMAL HIGH (ref 70–99)
Glucose-Capillary: 131 mg/dL — ABNORMAL HIGH (ref 70–99)

## 2023-07-05 MED ORDER — ENOXAPARIN SODIUM 30 MG/0.3ML IJ SOSY
30.0000 mg | PREFILLED_SYRINGE | INTRAMUSCULAR | Status: DC
  Administered 2023-07-07: 30 mg via SUBCUTANEOUS
  Filled 2023-07-05: qty 0.3

## 2023-07-05 MED ORDER — LORAZEPAM 1 MG PO TABS
1.0000 mg | ORAL_TABLET | Freq: Once | ORAL | Status: AC
  Administered 2023-07-06: 1 mg via ORAL
  Filled 2023-07-05: qty 1

## 2023-07-05 NOTE — Plan of Care (Signed)
  Problem: Education: Goal: Knowledge of General Education information will improve Description Including pain rating scale, medication(s)/side effects and non-pharmacologic comfort measures Outcome: Progressing   

## 2023-07-05 NOTE — Progress Notes (Signed)
[facility]  ROUNDING NOTE   Subjective:   Patient seen sitting at side of bed Alert and oriented Lower extremity edema improved.   Furosemide drip in place   Objective:  Vital signs in last 24 hours:  Temp:  [97.6 F (36.4 C)-98.8 F (37.1 C)] 97.6 F (36.4 C) (09/30 0737) Pulse Rate:  [76-99] 76 (09/30 1138) Resp:  [15-18] 16 (09/30 1138) BP: (128-167)/(59-82) 128/70 (09/30 1138) SpO2:  [92 %-100 %] 100 % (09/30 1138)  Weight change:  Filed Weights   06/30/23 1800 07/04/23 0837  Weight: 115.2 kg 120.8 kg    Intake/Output: I/O last 3 completed shifts: In: 262.4 [P.O.:120; I.V.:42.4; IV Piggyback:100] Out: -    Intake/Output this shift:  No intake/output data recorded.  Physical Exam: General: NAD  Head: Normocephalic, atraumatic. Moist oral mucosal membranes  Eyes: Anicteric  Lungs:  Clear to auscultation, normal effort  Heart: Regular rate and rhythm  Abdomen:  Soft, nontender  Extremities:  3+ peripheral edema.  Neurologic: Alert and oriented, moving all four extremities  Skin: No lesions  Access: None    Basic Metabolic Panel: Recent Labs  Lab 07/01/23 0432 07/01/23 1539 07/02/23 0252 07/03/23 0639 07/04/23 0456 07/05/23 0338  NA 138 139 138 140 138 138  K 3.0* 4.1 4.3 4.8 4.7 4.7  CL 109 107 106 109 108 107  CO2 23 22 24 23 22 22   GLUCOSE 127* 140* 142* 121* 73 99  BUN 33* 35* 40* 48* 54* 60*  CREATININE 3.23* 3.14* 3.40* 3.68* 3.83* 3.65*  CALCIUM 8.3* 8.1* 8.2* 8.5* 8.4* 8.4*  MG 1.8  --  1.8 2.0 2.1  --   PHOS 4.2  --  4.8* 5.0* 5.1*  --     Liver Function Tests: Recent Labs  Lab 06/30/23 2228 07/01/23 0432 07/05/23 0338  AST 13* 11* 12*  ALT 11 10 11   ALKPHOS 75 76 68  BILITOT 0.6 0.2* 0.4  PROT 6.4* 6.2* 5.6*  ALBUMIN 2.2* 2.1* 2.1*   No results for input(s): "LIPASE", "AMYLASE" in the last 168 hours. No results for input(s): "AMMONIA" in the last 168 hours.  CBC: Recent Labs  Lab 07/01/23 0432  07/02/23 0252 07/03/23 0639 07/04/23 0456 07/05/23 0338  WBC 9.1 6.4 7.3 7.4 7.0  NEUTROABS  --   --   --   --  4.3  HGB 10.1* 9.4* 9.0* 8.3* 8.3*  HCT 32.5* 30.6* 28.7* 26.3* 26.0*  MCV 76.3* 75.9* 74.7* 74.1* 73.4*  PLT 315 317 314 298 315    Cardiac Enzymes: No results for input(s): "CKTOTAL", "CKMB", "CKMBINDEX", "TROPONINI" in the last 168 hours.  BNP: Invalid input(s): "POCBNP"  CBG: Recent Labs  Lab 07/04/23 1131 07/04/23 1537 07/04/23 2012 07/05/23 0739 07/05/23 1138  GLUCAP 117* 91 120* 76 133*    Microbiology: Results for orders placed or performed during the hospital encounter of 06/30/23  Urine Culture (for pregnant, neutropenic or urologic patients or patients with an indwelling urinary catheter)     Status: Abnormal   Collection Time: 07/01/23  4:32 AM   Specimen: Urine, Clean Catch  Result Value Ref Range Status   Specimen Description   Final    URINE, CLEAN CATCH Performed at [facility], [address] [place], [place] [address]    Special Requests   Final    NONE Performed at [facility], [address], [place], [place] [address]    Culture >=100,000 COLONIES/mL ESCHERICHIA COLI (A)  Final   Report  Status 07/03/2023 FINAL  Final   Organism ID, Bacteria ESCHERICHIA COLI (A)  Final      Susceptibility   Escherichia coli - MIC*    AMPICILLIN >=32 RESISTANT Resistant     CEFAZOLIN <=4 SENSITIVE Sensitive     CEFEPIME <=0.12 SENSITIVE Sensitive     CEFTRIAXONE <=0.25 SENSITIVE Sensitive     CIPROFLOXACIN <=0.25 SENSITIVE Sensitive     GENTAMICIN <=1 SENSITIVE Sensitive     IMIPENEM <=0.25 SENSITIVE Sensitive     NITROFURANTOIN <=16 SENSITIVE Sensitive     TRIMETH/SULFA <=20 SENSITIVE Sensitive     AMPICILLIN/SULBACTAM 4 SENSITIVE Sensitive     PIP/TAZO <=4 SENSITIVE Sensitive     * >=100,000 COLONIES/mL ESCHERICHIA COLI    Coagulation Studies: Recent Labs    07/04/23 1522  LABPROT 14.3  INR 1.1     Urinalysis: No results for input(s): "COLORURINE", "LABSPEC", "PHURINE", "GLUCOSEU", "HGBUR", "BILIRUBINUR", "KETONESUR", "PROTEINUR", "UROBILINOGEN", "NITRITE", "LEUKOCYTESUR" in the last 72 hours.  Invalid input(s): "APPERANCEUR"     Imaging: No results found.   Medications:    furosemide (LASIX) 200 mg in dextrose 5 % 100 mL (2 mg/mL) infusion Stopped (07/04/23 1517)    cephALEXin  500 mg Oral Q12H   cyanocobalamin  1,000 mcg Intramuscular Q1200   Followed by   Melene Muller ON 07/09/2023] vitamin B-12  1,000 mcg Oral Daily   [START ON 07/07/2023] enoxaparin (LOVENOX) injection  30 mg Subcutaneous Q24H   feeding supplement (GLUCERNA SHAKE)  237 mL Oral TID BM   folic acid  1 mg Oral Daily   hydrALAZINE  50 mg Oral Q8H   insulin aspart  0-20 Units Subcutaneous TID WC   insulin aspart  0-5 Units Subcutaneous QHS   insulin glargine-yfgn  20 Units Subcutaneous QHS   isosorbide mononitrate  60 mg Oral Daily   metoprolol tartrate  75 mg Oral BID   pantoprazole  20 mg Oral Daily   rosuvastatin  20 mg Oral Daily   Vitamin D (Ergocalciferol)  50,000 Units Oral Q7 days   acetaminophen **OR** acetaminophen, alum & mag hydroxide-simeth, hydrALAZINE, ondansetron **OR** ondansetron (ZOFRAN) IV  Assessment/ Plan:  Ms. Shannon Cox is a 52 y.o.  female with medical problems of hypertension, insulin-dependent diabetes with neuropathy and retinopathy, foot drop, chronic kidney disease, anemia, morbid obesity, recurrent pancreatitis, was admitted on 06/30/2023 for Shortness of breath [R06.02] Acute CHF (HCC) [I50.9] AKI (acute kidney injury) (HCC) [N17.9]   Acute kidney injury on chronic kidney disease stage IIIb. Underlying CKD risk factors include poorly controlled diabetes, hypertension, obesity, use of nonsteroidals. Baseline creatinine of 1.85, GFR 33 from 04/14/2023.  Urine protein to creatinine ratio of 13 g. Empagliflozin, valsartan on hold.  Renal function remains stable.  Furosemide drip in place. Renal biopsy scheduled for tomorrow to evaluate proteinuria.   Lab Results  Component Value Date   CREATININE 3.65 (H) 07/05/2023   CREATININE 3.83 (H) 07/04/2023   CREATININE 3.68 (H) 07/03/2023    Intake/Output Summary (Last 24 hours) at 07/05/2023 1236 Last data filed at 07/04/2023 1535 Gross per 24 hour  Intake 140.6 ml  Output --  Net 140.6 ml   2. Diabetes type 2 with CKD Most recent hemoglobin A1c 7.1%. Current regimen includes insulin Semglee    3. Nephrotic proteinuria Urine protein to creatinine ratio of 13 g as outpatient. Serologies negative.  Most likely it is related to diabetes. Renal biopsy tomorrow.  Will order Ativan 1mg  prior to procedure.    4. Hypertension with lower  extremity edema Blood pressure 128/70. Continue hydralazine and metoprolol for now.    LOS: 4 Jamani Eley 9/30/202412:36 PM

## 2023-07-05 NOTE — Consult Note (Signed)
Chief Complaint: Patient was seen in consultation today for nephrotic proteinuria  Referring Physician(s): Munsoor Cherylann Ratel, MD  Supervising Physician: Oley Balm  Patient Status: [facility] - In-pt  History of Present Illness: Shannon Cox is a 52 y.o. female with PMH significant for type 2 diabetes mellitus, hypertension, and stage III CKD being seen today for proteinuria and nephrotic syndrome. Patient was admitted to [facility] on 06/30/23 with shortness of breath and increasing lower extremity edema. Patient has been followed by Nephrology team for nephrotic proteinuria and has requested image-guided renal biopsy to further evaluate the patient.   Past Medical History:  Diagnosis Date   Hypertension     History reviewed. No pertinent surgical history.  Allergies: Insulins and Sulfa antibiotics  Medications: Prior to Admission medications   Medication Sig Start Date End Date Taking? Authorizing Provider  amLODipine (NORVASC) 10 MG tablet Take 1 tablet (10 mg total) by mouth daily. 08/25/22 08/25/23 Yes Irean Hong, MD  cetirizine (ZYRTEC) 10 MG tablet Take 10 mg by mouth daily. 05/21/21  Yes [provider]  empagliflozin (JARDIANCE) 25 MG TABS tablet Take 1 tablet by mouth daily. 02/09/22  Yes [provider]  fluticasone (FLONASE) 50 MCG/ACT nasal spray Place 2 sprays into both nostrils daily. 06/02/22 07/01/23 Yes Shaune Pollack, MD  furosemide (LASIX) 20 MG tablet Take 1 tablet (20 mg total) by mouth daily for 4 days. 02/14/23 07/01/23 Yes Phineas Semen, MD  hydrALAZINE (APRESOLINE) 25 MG tablet Take 1 tablet by mouth 2 (two) times daily with a meal. 03/23/23  Yes [provider]  meclizine (ANTIVERT) 25 MG tablet Take 1 tablet (25 mg total) by mouth 3 (three) times daily as needed for dizziness. 06/02/22  Yes Shaune Pollack, MD  ondansetron (ZOFRAN-ODT) 4 MG disintegrating tablet Allow 1-2 tablets to dissolve in your mouth every 8 hours as  needed for nausea/vomiting 07/25/22  Yes Loleta Rose, MD  pantoprazole (PROTONIX) 20 MG tablet Take 1 tablet (20 mg total) by mouth daily. 10/06/22 10/06/23 Yes Phineas Semen, MD  potassium chloride SA (KLOR-CON M) 20 MEQ tablet Take 1 tablet (20 mEq total) by mouth daily for 5 days. 10/06/22 07/01/23 Yes Phineas Semen, MD  TRESIBA FLEXTOUCH 200 UNIT/ML FlexTouch Pen Inject 25 Units into the skin daily. 01/22/23  Yes [provider]  valsartan (DIOVAN) 320 MG tablet Take 320 mg by mouth daily. 12/29/22  Yes [provider]  amLODipine (NORVASC) 5 MG tablet Take 1 tablet (5 mg total) by mouth daily. 07/25/22 10/23/22  Loleta Rose, MD  Cholecalciferol 1.25 MG (50000 UT) capsule Take 50,000 Units by mouth once a week.    [provider]  diclofenac Sodium (VOLTAREN) 1 % GEL Apply 2 g topically 4 (four) times daily.    [provider]  glipiZIDE (GLUCOTROL XL) 5 MG 24 hr tablet Take 1 tablet by mouth 2 (two) times daily.    [provider]  HYDROcodone-acetaminophen (NORCO) 5-325 MG tablet Take 1 tablet by mouth every 6 (six) hours as needed for moderate pain. Patient not taking: Reported on 07/01/2023 08/25/22   Irean Hong, MD  lidocaine (XYLOCAINE) 2 % solution Use as directed 15 mLs in the mouth or throat as needed for mouth pain. Patient not taking: Reported on 07/01/2023 10/06/22   Phineas Semen, MD  oxyCODONE-acetaminophen (PERCOCET) 5-325 MG tablet Take 2 tablets by mouth every 6 (six) hours as needed for severe pain. Patient not taking: Reported on 07/01/2023 07/25/22   Loleta Rose, MD  History reviewed. No pertinent family history.  Social History   Socioeconomic History   Marital status: Divorced    Spouse name: Not on file   Number of children: Not on file   Years of education: Not on file   Highest education level: Not on file  Occupational History   Not on file  Tobacco Use   Smoking status: Never   Smokeless tobacco: Never   Substance and Sexual Activity   Alcohol use: Not Currently   Drug use: Never   Sexual activity: Not on file  Other Topics Concern   Not on file  Social History Narrative   Not on file   Social Determinants of Health   Financial Resource Strain: Low Risk  (03/18/2021)   Received from [facility], [facility]   Overall Financial Resource Strain (CARDIA)    Difficulty of Paying Living Expenses: Not very hard  Food Insecurity: No Food Insecurity (03/18/2021)   Received from [facility], [facility]   Hunger Vital Sign    Worried About Running Out of Food in the Last Year: Never true    Ran Out of Food in the Last Year: Never true  Transportation Needs: No Transportation Needs (03/18/2023)   Received from [facility] - Transportation    In the past 12 months, has lack of transportation kept you from medical appointments or from getting medications?: No    Lack of Transportation (Non-Medical): No  Physical Activity: Inactive (03/18/2021)   Received from [facility], [facility]   Exercise Vital Sign    Days of Exercise per Week: 0 days    Minutes of Exercise per Session: 0 min  Stress: Stress Concern Present (03/18/2021)   Received from [facility], [facility]   [employer] of Occupational Health - Occupational Stress Questionnaire    Feeling of Stress : Rather much  Social Connections: Unknown (03/18/2021)   Received from [facility], [facility]   Social Connection and Isolation Panel [NHANES]    Frequency of Communication with Friends and Family: More than three times a week    Frequency of Social Gatherings with Friends and Family: More than three times a week    Attends Religious Services: Never    Database administrator or Organizations: No    Attends Museum/gallery exhibitions officer: Never    Marital Status: Patient declined    Code status: Full code  Review of Systems: A 12 point ROS discussed and pertinent positives are indicated in the HPI above.  All other systems are negative.  Review of Systems  Constitutional:  Negative for chills and fever.  Respiratory:  Negative for chest tightness and shortness of breath.   Cardiovascular:  Positive for leg swelling. Negative for chest pain.  Gastrointestinal:  Negative for abdominal pain, diarrhea, nausea and vomiting.  Neurological:  Positive for dizziness and headaches.  Psychiatric/Behavioral:  Negative for confusion.     Vital Signs: BP 128/70 (BP Location: Left Arm)   Pulse 76   Temp 97.6 F (36.4 C) (Oral)   Resp 16   Ht 5\' 5"  (1.651 m)   Wt 266 lb 5.1 oz (120.8 kg)   LMP 04/05/2023 (Approximate)   SpO2 100%   BMI 44.32 kg/m     Physical Exam Vitals reviewed.  Constitutional:      General: She is not  in acute distress.    Appearance: She is obese. She is ill-appearing.  HENT:     Mouth/Throat:     Mouth: Mucous membranes are moist.  Cardiovascular:     Rate and Rhythm: Normal rate and regular rhythm.     Pulses: Normal pulses.     Heart sounds: Normal heart sounds.  Pulmonary:     Effort: Pulmonary effort is normal.     Breath sounds: Normal breath sounds.  Abdominal:     Palpations: Abdomen is soft.     Tenderness: There is no abdominal tenderness.  Musculoskeletal:     Right lower leg: Edema present.     Left lower leg: Edema present.     Comments: 4+ pitting edema bilaterally  Skin:    General: Skin is warm and dry.  Neurological:     Mental Status: She is alert and oriented to person, place, and time.  Psychiatric:        Mood and Affect: Mood normal.        Behavior: Behavior normal.        Judgment: Judgment normal.     Imaging: ECHOCARDIOGRAM COMPLETE  Result Date: 07/01/2023    ECHOCARDIOGRAM REPORT   Patient Name:   RIM KLEINTOP Brandon Date of  Exam: 07/01/2023 Medical Rec #:  [id number]             Height:       65.0 in Accession #:    [id number]            Weight:       254.0 lb Date of Birth:  04-10-1971              BSA:          2.190 m Patient Age:    52 years              BP:           189/111 mmHg Patient Gender: F                     HR:           93 bpm. Exam Location:  [facility] Procedure: 2D Echo, Cardiac Doppler and Color Doppler Indications:     CHF I50.31  History:         Patient has prior history of Echocardiogram examinations, most                  recent 03/18/2023. Elevated Troponin; CKD3a; Risk                  Factors:Diabetes, Non-Smoker, Sleep Apnea and Hypertension.  Sonographer:     Dondra Prader RVT RCS Referring Phys:  [id number] Andris Baumann Diagnosing Phys: Julien Nordmann MD IMPRESSIONS  1. Left ventricular ejection fraction, by estimation, is 50 to 55%. The left ventricle has low normal function. The left ventricle has no regional wall motion abnormalities. There is moderate left ventricular hypertrophy. Left ventricular diastolic parameters are consistent with Grade II diastolic dysfunction (pseudonormalization).  2. Right ventricular systolic function is normal. The right ventricular size is normal.  3. A small pericardial effusion is present.1.17 cm off the LV free wall, 0.8 cm off the RV free wall.  4. The mitral valve is normal in structure. Mild mitral valve regurgitation. No evidence of mitral stenosis.  5. The aortic valve is normal in structure. Aortic valve regurgitation is not visualized. No aortic stenosis is present.  6. The  inferior vena cava is dilated in size with >50% respiratory variability, suggesting right atrial pressure of 8 mmHg.  7. Left atrial size was mildly dilated. Comparison(s): Prior Echo done at [facility] NORMAL LEFT VENTRICULAR SYSTOLIC FUNCTION WITH MODERATE LVH NORMAL RIGHT VENTRICULAR SYSTOLIC FUNCTION NO VALVULAR STENOSIS MILD MITRAL AND TRICUSPID REGURGITAION DILATED IVC SMALL PEARDIAL EFFUSION WTIHOUT  EVIDENCE OF HEMODYNAMIC COMPROMISE.  FINDINGS  Left Ventricle: Left ventricular ejection fraction, by estimation, is 50 to 55%. The left ventricle has low normal function. The left ventricle has no regional wall motion abnormalities. The left ventricular internal cavity size was normal in size. There is moderate left ventricular hypertrophy. Left ventricular diastolic parameters are consistent with Grade II diastolic dysfunction (pseudonormalization). Right Ventricle: The right ventricular size is normal. No increase in right ventricular wall thickness. Right ventricular systolic function is normal. Left Atrium: Left atrial size was mildly dilated. Right Atrium: Right atrial size was normal in size. Pericardium: A small pericardial effusion is present. The pericardial effusion is circumferential. There is no evidence of cardiac tamponade. Mitral Valve: The mitral valve is normal in structure. Mild mitral valve regurgitation. No evidence of mitral valve stenosis. Tricuspid Valve: The tricuspid valve is normal in structure. Tricuspid valve regurgitation is mild . No evidence of tricuspid stenosis. Aortic Valve: The aortic valve is normal in structure. Aortic valve regurgitation is not visualized. No aortic stenosis is present. Aortic valve mean gradient measures 5.0 mmHg. Aortic valve peak gradient measures 9.6 mmHg. Aortic valve area, by VTI measures 2.55 cm. Pulmonic Valve: The pulmonic valve was normal in structure. Pulmonic valve regurgitation is not visualized. No evidence of pulmonic stenosis. Aorta: The aortic root is normal in size and structure. Venous: The inferior vena cava is dilated in size with greater than 50% respiratory variability, suggesting right atrial pressure of 8 mmHg. IAS/Shunts: No atrial level shunt detected by color flow Doppler.  LEFT VENTRICLE PLAX 2D LVIDd:         5.00 cm   Diastology LVIDs:         3.50 cm   LV e' medial:   6.42 cm/s LV PW:         1.30 cm   LV E/e' medial: 16.2 LV  IVS:        1.20 cm LVOT diam:     2.00 cm LV SV:         79 LV SV Index:   36 LVOT Area:     3.14 cm  RIGHT VENTRICLE             IVC RV Basal diam:  3.90 cm     IVC diam: 2.20 cm RV S prime:     14.30 cm/s TAPSE (M-mode): 2.0 cm LEFT ATRIUM             Index LA diam:        3.60 cm 1.64 cm/m LA Vol (A2C):   62.0 ml 28.29 ml/m LA Vol (A4C):   81.6 ml 37.27 ml/m LA Biplane Vol: 85.4 ml 39.00 ml/m  AORTIC VALVE                     PULMONIC VALVE AV Area (Vmax):    2.57 cm      PV Vmax:       0.96 m/s AV Area (Vmean):   2.48 cm      PV Peak grad:  3.6 mmHg AV Area (VTI):     2.55 cm AV Vmax:  155.00 cm/s AV Vmean:          109.000 cm/s AV VTI:            0.309 m AV Peak Grad:      9.6 mmHg AV Mean Grad:      5.0 mmHg LVOT Vmax:         127.00 cm/s LVOT Vmean:        86.200 cm/s LVOT VTI:          0.251 m LVOT/AV VTI ratio: 0.81  AORTA Ao Root diam: 2.70 cm Ao Asc diam:  3.10 cm MITRAL VALVE MV Area (PHT): 4.39 cm     SHUNTS MV Decel Time: 173 msec     Systemic VTI:  0.25 m MV E velocity: 104.00 cm/s  Systemic Diam: 2.00 cm MV A velocity: 72.60 cm/s MV E/A ratio:  1.43 Julien Nordmann MD Electronically signed by Julien Nordmann MD Signature Date/Time: 07/01/2023/6:40:03 PM    Final    DG Chest 2 View  Result Date: 06/30/2023 CLINICAL DATA:  Shortness of breath EXAM: CHEST - 2 VIEW COMPARISON:  Chest x-ray 08/24/2022 FINDINGS: The heart is enlarged. There is no focal lung infiltrate, pleural effusion or pneumothorax. No acute fractures are seen. IMPRESSION: 1. No active cardiopulmonary disease. 2. Cardiomegaly. Electronically Signed   By: Darliss Cheney M.D.   On: 06/30/2023 19:34    Labs:  CBC: Recent Labs    07/02/23 0252 07/03/23 0639 07/04/23 0456 07/05/23 0338  WBC 6.4 7.3 7.4 7.0  HGB 9.4* 9.0* 8.3* 8.3*  HCT 30.6* 28.7* 26.3* 26.0*  PLT 317 314 298 315    COAGS: Recent Labs    07/04/23 1522  INR 1.1    BMP: Recent Labs    07/02/23 0252 07/03/23 0639 07/04/23 0456  07/05/23 0338  NA 138 140 138 138  K 4.3 4.8 4.7 4.7  CL 106 109 108 107  CO2 24 23 22 22   GLUCOSE 142* 121* 73 99  BUN 40* 48* 54* 60*  CALCIUM 8.2* 8.5* 8.4* 8.4*  CREATININE 3.40* 3.68* 3.83* 3.65*  GFRNONAA 16* 14* 14* 14*    LIVER FUNCTION TESTS: Recent Labs    02/14/23 2054 06/30/23 2228 07/01/23 0432 07/05/23 0338  BILITOT 0.5 0.6 0.2* 0.4  AST 17 13* 11* 12*  ALT 13 11 10 11   ALKPHOS 81 75 76 68  PROT 6.5 6.4* 6.2* 5.6*  ALBUMIN 2.5* 2.2* 2.1* 2.1*    TUMOR MARKERS: No results for input(s): "AFPTM", "CEA", "CA199", "CHROMGRNA" in the last 8760 hours.  Assessment and Plan:  Shannon Cox is a 52 yo female being seen today in relation to nephrotic proteinuria. Request received for image-guided renal biopsy to further evaluate nephrotic proteinuria. Case reviewed and approved by Dr Deanne Coffer to occur tentatively for 07/06/23. Patient has had Lovenox held, to be NPO at midnight. INR of 1.1 on 07/04/23. CBC and BMP ordered for 10/1.   Risks and benefits of image-guided renal biopsy was discussed with the patient and/or patient's family including, but not limited to bleeding, infection, damage to adjacent structures or low yield requiring additional tests.  All of the questions were answered and there is agreement to proceed.  Consent signed and in chart.   Thank you for this interesting consult.  I greatly enjoyed meeting Shannon Cox [place] and look forward to participating in their care.  A copy of this report was sent to the requesting provider on this date.  Electronically Signed: Kennieth Francois, PA-C  07/05/2023, 3:45 PM   I spent a total of 20 Minutes  in face to face in clinical consultation, greater than 50% of which was counseling/coordinating care for nephrotic proteinuria.

## 2023-07-05 NOTE — Progress Notes (Signed)
Physical Therapy Treatment Patient Details Name: Shannon Cox MRN: [id number] DOB: 01-24-1971 Today's Date: 07/05/2023   History of Present Illness Pt is a 52 y.o. 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, ambulant with cane, CKD 3a with anemia of CKD, morbid obesity, recurrent pancreatitis. Pt presents to the ED with a complaint of shortness of breath that acutely worsened on the evening of presentation while lying flat, improved somewhat by sitting upright.    PT Comments  Pt is received in bed, she is agreeable to PT session. Pt performs bed mobility and transfers sup A for safety. Deferred amb activities at this time due to BLE weakness and "not wanting to walk right now". Pt able to perform BLE exercises in standing and bed to promote BLE strengthening. Pt required multiple breaks in between sets due to reports of LBP and fatigue. Pt reports 6/10 NPS LBP at end of standing exercises which decreased once supine. Overall, Pt is steadily progressing towards PT goals but cont to demonstrate BLE weakness. Pt would benefit from cont skilled PT to address above deficits and promote optimal return to PLOF.    If plan is discharge home, recommend the following: A little help with walking and/or transfers;A little help with bathing/dressing/bathroom;Assist for transportation;Help with stairs or ramp for entrance   Can travel by private vehicle        Equipment Recommendations  Rolling walker (2 wheels)    Recommendations for Other Services       Precautions / Restrictions Precautions Precautions: Fall Precaution Comments: chronic LBP Restrictions Weight Bearing Restrictions: No     Mobility  Bed Mobility Overal bed mobility: Needs Assistance Bed Mobility: Supine to Sit, Sit to Supine     Supine to sit: Supervision Sit to supine: Supervision   General bed mobility comments: able to  perform bed mobility sup A for safety and line management    Transfers Overall transfer level: Needs assistance Equipment used: Rolling walker (2 wheels) Transfers: Sit to/from Stand Sit to Stand: Supervision           General transfer comment: STS from EOB with RW; required extended time to complete task due to "brain not connected to body"    Ambulation/Gait               General Gait Details: Deferred at this time secondary to fatigue   Stairs             Wheelchair Mobility     Tilt Bed    Modified Rankin (Stroke Patients Only)       Balance Overall balance assessment: Needs assistance Sitting-balance support: Feet supported Sitting balance-Leahy Scale: Normal Sitting balance - Comments: able to maintain seated EOB balance during functional activities   Standing balance support: Bilateral upper extremity supported, During functional activity, Reliant on assistive device for balance Standing balance-Leahy Scale: Fair Standing balance comment: heavy reliance on AD; able to maintain static standing balance during functional activities                            Cognition Arousal: Alert Behavior During Therapy: Flat affect Overall Cognitive Status: Within Functional Limits for tasks assessed                                 General Comments: AO x4; pleasant  but relatively quiet during session        Exercises Other Exercises Other Exercises: 2x10 mini squats using RW, 1x10 standing alt marches using RW, 1x6 modified bridges in bed    General Comments General comments (skin integrity, edema, etc.): Pt reports 6/10 NPS LBP during standing exercise activities but not wanting medication      Pertinent Vitals/Pain Pain Assessment Pain Assessment: 0-10 Pain Score: 6  Pain Location: low back Pain Descriptors / Indicators: Discomfort Pain Intervention(s): Limited activity within patient's tolerance    Home Living                           Prior Function            PT Goals (current goals can now be found in the care plan section) Acute Rehab PT Goals Patient Stated Goal: to go home PT Goal Formulation: With patient Time For Goal Achievement: 07/16/23 Potential to Achieve Goals: Good Progress towards PT goals: Progressing toward goals    Frequency    Min 1X/week      PT Plan      Co-evaluation              AM-PAC PT "6 Clicks" Mobility   Outcome Measure  Help needed turning from your back to your side while in a flat bed without using bedrails?: A Little Help needed moving from lying on your back to sitting on the side of a flat bed without using bedrails?: A Little Help needed moving to and from a bed to a chair (including a wheelchair)?: A Little Help needed standing up from a chair using your arms (e.g., wheelchair or bedside chair)?: A Little Help needed to walk in hospital room?: A Little Help needed climbing 3-5 steps with a railing? : A Lot 6 Click Score: 17    End of Session   Activity Tolerance: Patient limited by fatigue;Patient limited by pain Patient left: in bed;with call bell/phone within reach Nurse Communication: Mobility status PT Visit Diagnosis: Unsteadiness on feet (R26.81);Repeated falls (R29.6);History of falling (Z91.81);Muscle weakness (generalized) (M62.81);Pain Pain - Right/Left:  (bilateral) Pain - part of body:  (low back)     Time: 4098-1191 PT Time Calculation (min) (ACUTE ONLY): 18 min  Charges:                            Elmon Else, SPT    Shady Padron 07/05/2023, 3:48 PM

## 2023-07-05 NOTE — Progress Notes (Signed)
[facility] Progress Note  Patient: Shannon Cox    ZOX:[id number]  DOA: 06/30/2023     Date of Service: the patient was seen and examined on 07/05/2023  Chief Complaint  Patient presents with   Shortness of Breath   Brief hospital course: Shannon Cox is a 52 y.o. 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, ambulant with cane, CKD 3a with anemia of CKD, morbid obesity, recurrent pancreatitis, hospitalized  at [facility] in June 2024 for nausea vomiting and diarrhea, discharged to outpatient GI for upper and lower endoscopy, and who during that admission was found to have lower extremity edema, placed on Lasix and discharged with outpatient follow-up to nephrology to evaluate for nephrotic syndrome, who presents to the ED with a complaint of shortness of breath that acutely worsened on the evening of presentation while lying flat, improved somewhat by sitting upright.  She had an upper endoscopy the day prior at [facility] that showed gastritis, biopsies pending.  She reports that her lower extremity edema has been worsening for the past week.   ED course and data review: BP in the ED in the 170s to 180s with pulse in the 90s and otherwise normal vitals.  O2 sats in the high 90s on room air. Labs: Notable for troponin 27 and BNP 250.  WBC normal and hemoglobin 9.6.  Potassium 3.4.  Creatinine 3.33 up from baseline of 1.6. EKG, personally viewed and interpreted showing NSR at 95 with no acute ST-T wave changes. Chest x-ray shows cardiomegaly with no active cardiopulmonary disease Patient treated with IV Lasix 40 mg and hospitalist consulted for admission.   Assessment and Plan:  # Nephrotic syndrome Acute renal failure superimposed on stage 3b chronic kidney disease Anemia of CKD 3b Baseline creatinine 1.6 Held po Lasix, valsartan and Jardiance Home meds on admission Patient had an  outpatient  nephrology referral for workup for nephrotic syndrome(had SPEP UPEP done while at [facility]) Nephrology consulted Urinalysis positive for UTI, urine protein> 3000, nephrotic range Patient does not take aspirin at home, she was given only 1 dose on 9/26 Nephrology consulted, recommended renal biopsy, IR consulted, renal biopsy will be done tomorrow a.m.  Follow nephrology regarding IV diuresis Creatinine 3.83--3.65 stable today   Acute heart failure  Elevated troponin, suspect demand ischemia History of small pericardial effusion June 2024 Hypertensive urgency/emergency Echo 03/18/2023 showed EF>55% and small pericardial effusion without hemodynamic compromise Given cardiomegaly on chest x-ray will repeat echo to evaluate size of pericardial effusion S/p IV Lasix, Hold ACE/ARB's for now.   Hold amlodipine for possible contribution to lower extremity edema Increased hydralazine 50 mg p.o. 3 times daily, started Imdur 60 mg p.o. daily and metoprolol 75 mg p.o. twice daily Continue Lasix IV infusion  Cardiology consult appreciated, recommended continue current treatment and signed off.    UTI, UA positive, urine culture growing E. coli, sensitive to ceftriaxone, resistant to ampicillin only. S/p ceftriaxone 1 g IV daily, transition to Keflex 500 mg p.o. twice daily for 3 days to complete 5-day course.  Hypokalemia, potassium repleted cautiously due to CKD 3.  Resolved  Hypertensive urgency, uncontrolled blood pressure Increase hydralazine 50 mg p.o. 3 times daily 9/28 increased metoprolol 75 mg p.o. twice daily  9/28 increased Imdur 60 mg p.o. daily S/p Lasix IV infusion, DC'd due to worsening of renal functions Monitor BP and titrate medications accordingly   OSA (obstructive sleep apnea) Suspected OSA Patient  with recently did a home sleep study and is awaiting the results   Diabetic neuropathy (HCC) Multimodal pain control.  Avoid NSAIDs   Obesity, Class III, BMI  40-49.9 (morbid obesity) (HCC) Complicating factor to overall prognosis and care   Insulin dependent type 2 diabetes mellitus.  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 Follow-up with PCP to repeat folic acid level after 3 to 6 months.  Vitamin B12 level 194, goal >400, started vitamin B12 1000 mcg IM injection daily during hospital stay followed by oral supplement. Follow-up with PCP to repeat vitamin B12 level after 3 to 6 months.  Vitamin D insufficiency, started vitamin D 50,000 units p.o. weekly.  Follow-up with PCP to repeat vitamin D level after 3 to 6 months  Body mass index is 44.32 kg/m.  Interventions:  Diet: Carb modified/heart healthy diet with fluid striction 1.5 L/day DVT Prophylaxis: Subcutaneous Lovenox   Advance goals of care discussion: Full code  Family Communication: family was not present at bedside, at the time of interview.  The pt provided permission to discuss medical plan with the family. Opportunity was given to ask question and all questions were answered satisfactorily.   Disposition:  Pt is from Home, admitted with volume overload, AKI, nephrotic syndrome, needs renal biopsy, still has elevated creatinine, which precludes a safe discharge. Discharge to Home with HH TBD after PT and OT eval, when stable, may need few days to stay in the hospital..  Subjective: No significant events overnight, patient was sitting audibly on the bed, still has lower extremity edema, no any other complaints.  Patient agreed with renal biopsy which will be done tomorrow a.m.  Physical Exam: General: NAD, lying comfortably Appear in no distress, affect appropriate Eyes: PERRLA ENT: Oral Mucosa Clear, moist  Neck: no JVD,  Cardiovascular: S1 and S2 Present, no  Murmur,  Respiratory: good respiratory effort, Bilateral Air entry equal and Decreased, no Crackles, no wheezes Abdomen: Bowel Sound present, Soft and no tenderness,  Skin: no rashes Extremities: 4+ pedal edema, no calf tenderness, chronic right foot drop Neurologic: without any new focal findings Gait not checked due to patient safety concerns  Vitals:   07/04/23 2340 07/05/23 0514 07/05/23 0737 07/05/23 1138  BP: (!) 167/82 (!) 145/74 (!) 155/70 128/70  Pulse: 88  99 76  Resp: 18 18 15 16   Temp: 98.5 F (36.9 C) 98.8 F (37.1 C) 97.6 F (36.4 C)   TempSrc:   Oral   SpO2: 92%  98% 100%  Weight:      Height:        Intake/Output Summary (Last 24 hours) at 07/05/2023 1440 Last data filed at 07/04/2023 1535 Gross per 24 hour  Intake 140.6 ml  Output --  Net 140.6 ml   Filed Weights   06/30/23 1800 07/04/23 0837  Weight: 115.2 kg 120.8 kg    Data Reviewed: I have personally reviewed and interpreted daily labs, tele strips, imagings as discussed above. I reviewed all nursing notes, pharmacy notes, vitals, pertinent old records I have discussed plan of care as described above with RN and patient/family.  CBC: Recent Labs  Lab 07/01/23 0432 07/02/23 0252 07/03/23 0639 07/04/23 0456 07/05/23 0338  WBC 9.1 6.4 7.3 7.4 7.0  NEUTROABS  --   --   --   --  4.3  HGB 10.1* 9.4* 9.0* 8.3* 8.3*  HCT 32.5* 30.6* 28.7* 26.3* 26.0*  MCV 76.3* 75.9* 74.7* 74.1* 73.4*  PLT 315 317 314 298 315   Basic Metabolic Panel: Recent Labs  Lab 07/01/23 0432 07/01/23 1539 07/02/23 0252 07/03/23 0639 07/04/23 0456 07/05/23 0338  NA 138 139 138 140 138 138  K 3.0* 4.1 4.3 4.8 4.7 4.7  CL 109 107 106 109 108 107  CO2 23 22 24 23 22 22   GLUCOSE 127* 140* 142* 121* 73 99  BUN 33* 35* 40* 48* 54* 60*  CREATININE 3.23* 3.14* 3.40* 3.68* 3.83* 3.65*  CALCIUM 8.3* 8.1* 8.2* 8.5* 8.4* 8.4*  MG 1.8  --  1.8 2.0 2.1  --   PHOS 4.2  --  4.8* 5.0* 5.1*  --     Studies: No results found.   Scheduled Meds:  cephALEXin  500 mg Oral Q12H   cyanocobalamin  1,000 mcg Intramuscular Q1200   Followed by   Melene Muller ON 07/09/2023] vitamin B-12  1,000 mcg Oral Daily   [START ON 07/07/2023] enoxaparin (LOVENOX) injection  30 mg Subcutaneous Q24H   feeding supplement (GLUCERNA SHAKE)  237 mL Oral TID BM   folic acid  1 mg Oral Daily   hydrALAZINE  50 mg Oral Q8H   insulin aspart  0-20 Units Subcutaneous TID WC   insulin aspart  0-5 Units Subcutaneous QHS   insulin glargine-yfgn  20 Units Subcutaneous QHS   isosorbide mononitrate  60 mg Oral Daily   metoprolol tartrate  75 mg Oral BID   pantoprazole  20 mg Oral Daily   rosuvastatin  20 mg Oral Daily   Vitamin D (Ergocalciferol)  50,000 Units Oral Q7 days   Continuous Infusions:  furosemide (LASIX) 200 mg in dextrose 5 % 100 mL (2 mg/mL) infusion Stopped (07/04/23 1517)   PRN Meds: acetaminophen **OR** acetaminophen, alum & mag hydroxide-simeth, hydrALAZINE, ondansetron **OR** ondansetron (ZOFRAN) IV  Time spent: 35 minutes  Author: Gillis Santa. MD [facility] 07/05/2023 2:40 PM  To reach On-call, see care teams to locate the attending and reach out to them via [email or web address]. If 7PM-7AM, please contact night-coverage If you still have difficulty reaching the attending provider, please page the [facility] (Director on Call) for [facility] on amion for assistance.

## 2023-07-06 ENCOUNTER — Inpatient Hospital Stay: Payer: BLUE CROSS/BLUE SHIELD

## 2023-07-06 DIAGNOSIS — N170 Acute kidney failure with tubular necrosis: Secondary | ICD-10-CM | POA: Diagnosis not present

## 2023-07-06 DIAGNOSIS — N1832 Chronic kidney disease, stage 3b: Secondary | ICD-10-CM | POA: Diagnosis not present

## 2023-07-06 LAB — CBC
HCT: 27.5 % — ABNORMAL LOW (ref 36.0–46.0)
Hemoglobin: 8.8 g/dL — ABNORMAL LOW (ref 12.0–15.0)
MCH: 23.3 pg — ABNORMAL LOW (ref 26.0–34.0)
MCHC: 32 g/dL (ref 30.0–36.0)
MCV: 72.8 fL — ABNORMAL LOW (ref 80.0–100.0)
Platelets: 322 10*3/uL (ref 150–400)
RBC: 3.78 MIL/uL — ABNORMAL LOW (ref 3.87–5.11)
RDW: 20.2 % — ABNORMAL HIGH (ref 11.5–15.5)
WBC: 7.6 10*3/uL (ref 4.0–10.5)
nRBC: 0 % (ref 0.0–0.2)

## 2023-07-06 LAB — BASIC METABOLIC PANEL
Anion gap: 8 (ref 5–15)
BUN: 65 mg/dL — ABNORMAL HIGH (ref 6–20)
CO2: 23 mmol/L (ref 22–32)
Calcium: 8.5 mg/dL — ABNORMAL LOW (ref 8.9–10.3)
Chloride: 104 mmol/L (ref 98–111)
Creatinine, Ser: 3.69 mg/dL — ABNORMAL HIGH (ref 0.44–1.00)
GFR, Estimated: 14 mL/min — ABNORMAL LOW (ref >60)
Glucose, Bld: 113 mg/dL — ABNORMAL HIGH (ref 70–99)
Potassium: 4.7 mmol/L (ref 3.5–5.1)
Sodium: 135 mmol/L (ref 135–145)

## 2023-07-06 LAB — GLUCOSE, CAPILLARY
Glucose-Capillary: 87 mg/dL (ref 70–99)
Glucose-Capillary: 76 mg/dL (ref 70–99)
Glucose-Capillary: 80 mg/dL (ref 70–99)
Glucose-Capillary: 152 mg/dL — ABNORMAL HIGH (ref 70–99)
Glucose-Capillary: 132 mg/dL — ABNORMAL HIGH (ref 70–99)

## 2023-07-06 LAB — ALDOSTERONE + RENIN ACTIVITY W/ RATIO
ALDO / PRA Ratio: <4.6 (ref 0.0–30.0)
Aldosterone: <1 ng/dL (ref 0.0–30.0)
PRA LC/MS/MS: 0.219 ng/mL/h (ref 0.167–5.380)

## 2023-07-06 MED ORDER — ONDANSETRON HCL 4 MG/2ML IJ SOLN
INTRAMUSCULAR | Status: AC
  Filled 2023-07-06: qty 2

## 2023-07-06 MED ORDER — MIDAZOLAM HCL 2 MG/2ML IJ SOLN
INTRAMUSCULAR | Status: AC | PRN
Start: 2023-07-06 — End: 2023-07-06
  Administered 2023-07-06: 1 mg via INTRAVENOUS

## 2023-07-06 MED ORDER — LIDOCAINE HCL (PF) 1 % IJ SOLN
10.0000 mL | Freq: Once | INTRAMUSCULAR | Status: AC
  Administered 2023-07-06: 10 mL via INTRADERMAL

## 2023-07-06 MED ORDER — MIDAZOLAM HCL 2 MG/2ML IJ SOLN
INTRAMUSCULAR | Status: AC
  Filled 2023-07-06: qty 4

## 2023-07-06 MED ORDER — FENTANYL CITRATE (PF) 100 MCG/2ML IJ SOLN
INTRAMUSCULAR | Status: AC
  Filled 2023-07-06: qty 2

## 2023-07-06 MED ORDER — SODIUM CHLORIDE 0.9 % IV SOLN: Freq: Once | INTRAVENOUS | Status: DC

## 2023-07-06 MED ORDER — FENTANYL CITRATE (PF) 100 MCG/2ML IJ SOLN
INTRAMUSCULAR | Status: AC | PRN
Start: 2023-07-06 — End: 2023-07-06
  Administered 2023-07-06: 50 ug via INTRAVENOUS

## 2023-07-06 NOTE — Plan of Care (Signed)
  Problem: Education: Goal: Knowledge of General Education information will improve Description Including pain rating scale, medication(s)/side effects and non-pharmacologic comfort measures Outcome: Progressing   

## 2023-07-06 NOTE — Progress Notes (Signed)
[facility]  ROUNDING NOTE   Subjective:   Patient sitting at side of bed Alert Anxious about procedure Npo  Furosemide drip    Objective:  Vital signs in last 24 hours:  Temp:  [97.7 F (36.5 C)-98.5 F (36.9 C)] 97.9 F (36.6 C) (10/01 1034) Pulse Rate:  [72-85] 73 (10/01 1200) Resp:  [10-24] 12 (10/01 1200) BP: (119-163)/(57-82) 138/78 (10/01 1200) SpO2:  [94 %-100 %] 98 % (10/01 1200) Weight:  [120.8 kg] 120.8 kg (10/01 1034)  Weight change:  Filed Weights   06/30/23 1800 07/04/23 0837 07/06/23 1034  Weight: 115.2 kg 120.8 kg 120.8 kg    Intake/Output: No intake/output data recorded.   Intake/Output this shift:  No intake/output data recorded.  Physical Exam: General: NAD  Head: Normocephalic, atraumatic. Moist oral mucosal membranes  Eyes: Anicteric  Lungs:  Clear to auscultation, normal effort  Heart: Regular rate and rhythm  Abdomen:  Soft, nontender  Extremities:  3+ peripheral edema.  Neurologic: Alert and oriented, moving all four extremities  Skin: No lesions  Access: None    Basic Metabolic Panel: Recent Labs  Lab 07/01/23 0432 07/01/23 1539 07/02/23 0252 07/03/23 0639 07/04/23 0456 07/05/23 0338 07/06/23 0451  NA 138   < > 138 140 138 138 135  K 3.0*   < > 4.3 4.8 4.7 4.7 4.7  CL 109   < > 106 109 108 107 104  CO2 23   < > 24 23 22 22 23   GLUCOSE 127*   < > 142* 121* 73 99 113*  BUN 33*   < > 40* 48* 54* 60* 65*  CREATININE 3.23*   < > 3.40* 3.68* 3.83* 3.65* 3.69*  CALCIUM 8.3*   < > 8.2* 8.5* 8.4* 8.4* 8.5*  MG 1.8  --  1.8 2.0 2.1  --   --   PHOS 4.2  --  4.8* 5.0* 5.1*  --   --    < > = values in this interval not displayed.    Liver Function Tests: Recent Labs  Lab 06/30/23 2228 07/01/23 0432 07/05/23 0338  AST 13* 11* 12*  ALT 11 10 11   ALKPHOS 75 76 68  BILITOT 0.6 0.2* 0.4  PROT 6.4* 6.2* 5.6*  ALBUMIN 2.2* 2.1* 2.1*   No results for input(s): "LIPASE", "AMYLASE" in the last 168 hours. No results  for input(s): "AMMONIA" in the last 168 hours.  CBC: Recent Labs  Lab 07/02/23 0252 07/03/23 0639 07/04/23 0456 07/05/23 0338 07/06/23 0451  WBC 6.4 7.3 7.4 7.0 7.6  NEUTROABS  --   --   --  4.3  --   HGB 9.4* 9.0* 8.3* 8.3* 8.8*  HCT 30.6* 28.7* 26.3* 26.0* 27.5*  MCV 75.9* 74.7* 74.1* 73.4* 72.8*  PLT 317 314 298 315 322    Cardiac Enzymes: No results for input(s): "CKTOTAL", "CKMB", "CKMBINDEX", "TROPONINI" in the last 168 hours.  BNP: Invalid input(s): "POCBNP"  CBG: Recent Labs  Lab 07/05/23 1623 07/05/23 2023 07/06/23 0841 07/06/23 1054 07/06/23 1202  GLUCAP 129* 131* 87 76 80    Microbiology: Results for orders placed or performed during the hospital encounter of 06/30/23  Urine Culture (for pregnant, neutropenic or urologic patients or patients with an indwelling urinary catheter)     Status: Abnormal   Collection Time: 07/01/23  4:32 AM   Specimen: Urine, Clean Catch  Result Value Ref Range Status   Specimen Description   Final    URINE, CLEAN CATCH Performed at  [facility], [address] [place], [place] [address]    Special Requests   Final    NONE Performed at [facility], [address] [place], [place] [address]    Culture >=100,000 COLONIES/mL ESCHERICHIA COLI (A)  Final   Report Status 07/03/2023 FINAL  Final   Organism ID, Bacteria ESCHERICHIA COLI (A)  Final      Susceptibility   Escherichia coli - MIC*    AMPICILLIN >=32 RESISTANT Resistant     CEFAZOLIN <=4 SENSITIVE Sensitive     CEFEPIME <=0.12 SENSITIVE Sensitive     CEFTRIAXONE <=0.25 SENSITIVE Sensitive     CIPROFLOXACIN <=0.25 SENSITIVE Sensitive     GENTAMICIN <=1 SENSITIVE Sensitive     IMIPENEM <=0.25 SENSITIVE Sensitive     NITROFURANTOIN <=16 SENSITIVE Sensitive     TRIMETH/SULFA <=20 SENSITIVE Sensitive     AMPICILLIN/SULBACTAM 4 SENSITIVE Sensitive     PIP/TAZO <=4 SENSITIVE Sensitive     * >=100,000 COLONIES/mL ESCHERICHIA COLI     Coagulation Studies: Recent Labs    07/04/23 1522  LABPROT 14.3  INR 1.1    Urinalysis: No results for input(s): "COLORURINE", "LABSPEC", "PHURINE", "GLUCOSEU", "HGBUR", "BILIRUBINUR", "KETONESUR", "PROTEINUR", "UROBILINOGEN", "NITRITE", "LEUKOCYTESUR" in the last 72 hours.  Invalid input(s): "APPERANCEUR"     Imaging: No results found.   Medications:    sodium chloride     furosemide (LASIX) 200 mg in dextrose 5 % 100 mL (2 mg/mL) infusion 4 mg/hr (07/05/23 2120)    cephALEXin  500 mg Oral Q12H   cyanocobalamin  1,000 mcg Intramuscular Q1200   Followed by   Melene Muller ON 07/09/2023] vitamin B-12  1,000 mcg Oral Daily   [START ON 07/07/2023] enoxaparin (LOVENOX) injection  30 mg Subcutaneous Q24H   feeding supplement (GLUCERNA SHAKE)  237 mL Oral TID BM   fentaNYL       folic acid  1 mg Oral Daily   hydrALAZINE  50 mg Oral Q8H   insulin aspart  0-20 Units Subcutaneous TID WC   insulin aspart  0-5 Units Subcutaneous QHS   insulin glargine-yfgn  20 Units Subcutaneous QHS   isosorbide mononitrate  60 mg Oral Daily   metoprolol tartrate  75 mg Oral BID   midazolam       ondansetron       pantoprazole  20 mg Oral Daily   rosuvastatin  20 mg Oral Daily   Vitamin D (Ergocalciferol)  50,000 Units Oral Q7 days   acetaminophen **OR** acetaminophen, alum & mag hydroxide-simeth, fentaNYL, hydrALAZINE, midazolam, ondansetron, ondansetron **OR** ondansetron (ZOFRAN) IV  Assessment/ Plan:  Ms. Shannon Cox is a 52 y.o.  female with medical problems of hypertension, insulin-dependent diabetes with neuropathy and retinopathy, foot drop, chronic kidney disease, anemia, morbid obesity, recurrent pancreatitis, was admitted on 06/30/2023 for Shortness of breath [R06.02] Acute CHF (HCC) [I50.9] AKI (acute kidney injury) (HCC) [N17.9]   Acute kidney injury on chronic kidney disease stage IIIb. Underlying CKD risk factors include poorly controlled diabetes, hypertension, obesity,  use of nonsteroidals. Baseline creatinine of 1.85, GFR 33 from 04/14/2023.  Urine protein to creatinine ratio of 13 g. Empagliflozin, valsartan on hold.  Creatinine stable, no urine output recorded. Will continue to monitor. No indication for dialysis.   Lab Results  Component Value Date   CREATININE 3.69 (H) 07/06/2023   CREATININE 3.65 (H) 07/05/2023   CREATININE 3.83 (H) 07/04/2023    Intake/Output Summary (Last 24 hours) at 07/06/2023 1215 Last data filed at 07/06/2023 [phone]  Gross per 24 hour  Intake 0 ml  Output --  Net 0 ml   2. Diabetes type 2 with CKD Most recent hemoglobin A1c 7.1%. Current regimen includes insulin Semglee  Glucose well controlled.     3. Nephrotic proteinuria Urine protein to creatinine ratio of 13 g as outpatient. Serologies negative.  Most likely it is related to diabetes.  Appreciate IR completing renal biopsy. Sample sent to [facility] and expect preliminary results in 24-48hrs with final report in 5 days. Lorazepam 1mg  given prior to procedure for anxiety.   4. Hypertension with lower extremity edema  Continue hydralazine and metoprolol for now. Blood pressure acceptable.     LOS: 5 Shannon Cox 10/1/202412:15 PM

## 2023-07-06 NOTE — Progress Notes (Signed)
OT Cancellation Note  Patient Details Name: Shannon Cox MRN: [id number] DOB: 10-Mar-1971   Cancelled Treatment:    Reason Eval/Treat Not Completed: Patient at procedure or test/ unavailable. Chart reviewed. Pt off unit for procedure, will re-attempt as available.   Kathie Dike, M.S. OTR/L  07/06/23, 11:21 AM  ascom [phone]

## 2023-07-06 NOTE — Plan of Care (Signed)

## 2023-07-06 NOTE — Progress Notes (Signed)
[facility] Progress Note  Patient: Shannon Cox    HQI:[id number]  DOA: 06/30/2023     Date of Service: the patient was seen and examined on 07/06/2023  Chief Complaint  Patient presents with   Shortness of Breath   Brief hospital course: Shannon Cox is a 52 y.o. 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, ambulant with cane, CKD 3a with anemia of CKD, morbid obesity, recurrent pancreatitis, hospitalized  at [facility] in June 2024 for nausea vomiting and diarrhea, discharged to outpatient GI for upper and lower endoscopy, and who during that admission was found to have lower extremity edema, placed on Lasix and discharged with outpatient follow-up to nephrology to evaluate for nephrotic syndrome, who presents to the ED with a complaint of shortness of breath that acutely worsened on the evening of presentation while lying flat, improved somewhat by sitting upright.  She had an upper endoscopy the day prior at [facility] that showed gastritis, biopsies pending.  She reports that her lower extremity edema has been worsening for the past week.   ED course and data review: BP in the ED in the 170s to 180s with pulse in the 90s and otherwise normal vitals.  O2 sats in the high 90s on room air. Labs: Notable for troponin 27 and BNP 250.  WBC normal and hemoglobin 9.6.  Potassium 3.4.  Creatinine 3.33 up from baseline of 1.6. EKG, personally viewed and interpreted showing NSR at 95 with no acute ST-T wave changes. Chest x-ray shows cardiomegaly with no active cardiopulmonary disease Patient treated with IV Lasix 40 mg and hospitalist consulted for admission.   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   Follow biopsy report Follow nephrology for further recommendation. Creatinine 3.83--3.65--3.69 stable today   Acute heart failure  Elevated troponin, suspect demand ischemia History of small pericardial effusion June 2024 Hypertensive urgency/emergency Echo 03/18/2023 showed EF>55% and small pericardial effusion without hemodynamic compromise Given cardiomegaly on chest x-ray will repeat echo to evaluate size of pericardial effusion S/p IV Lasix, Hold ACE/ARB's for now.   Hold amlodipine for possible contribution to lower extremity edema Increased hydralazine 50 mg p.o. 3 times daily, started Imdur 60 mg p.o. daily and metoprolol 75 mg p.o. twice daily Continue Lasix IV infusion  Cardiology consult appreciated, recommended continue current treatment and signed off.    UTI, UA positive, urine culture growing E. coli, sensitive to ceftriaxone, resistant to ampicillin only. S/p ceftriaxone 1 g IV daily, transition to Keflex 500 mg p.o. twice daily for 3 days to complete 5-day course.  Hypokalemia, potassium repleted cautiously due to CKD 3.  Resolved  Hypertensive urgency, uncontrolled blood pressure Increase hydralazine 50 mg p.o. 3 times daily 9/28 increased metoprolol 75 mg p.o. twice daily  9/28 increased Imdur 60 mg p.o. daily On Lasix IV infusion 10/1 blood pressure is well-controlled now Monitor BP and titrate medications accordingly   OSA (obstructive sleep apnea) Suspected  OSA Patient with recently did a home sleep study and is awaiting the results   Diabetic neuropathy (HCC) Multimodal pain control.  Avoid NSAIDs    Obesity, Class III, BMI 40-49.9 (morbid obesity) (HCC) Complicating factor to overall prognosis and care   Insulin dependent type 2 diabetes mellitus.  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 Follow-up with PCP to repeat folic acid level after 3 to 6 months.  Vitamin B12 level 194, goal >400, started vitamin B12 1000 mcg IM injection daily during hospital stay followed by oral supplement. Follow-up with PCP to repeat vitamin B12 level after 3 to 6 months.  Vitamin D insufficiency, started vitamin D 50,000 units p.o. weekly.  Follow-up with PCP to repeat vitamin D level after 3 to 6 months  Body mass index is 44.32 kg/m.  Interventions:  Diet: Carb modified/heart healthy diet with fluid striction 1.5 L/day DVT Prophylaxis: Subcutaneous Lovenox   Advance goals of care discussion: Full code  Family Communication: family was not present at bedside, at the time of interview.  The pt provided permission to discuss medical plan with the family. Opportunity was given to ask question and all questions were answered satisfactorily.   Disposition:  Pt is from Home, admitted with volume overload, AKI, nephrotic syndrome, needs renal biopsy, still has elevated creatinine, which precludes a safe discharge. Discharge to Home with HH TBD after PT and OT eval, when stable, may need few days to stay in the hospital..  Subjective: No significant events overnight, patient was seen after renal biopsy, tolerated procedure well.  Denied any complaints.  No pain.  Still has significant lower extremity edema.   Physical Exam: General: NAD, lying comfortably Appear in no distress, affect appropriate Eyes: PERRLA ENT: Oral Mucosa Clear, moist  Neck: no JVD,  Cardiovascular: S1 and S2 Present, no  Murmur,  Respiratory: good respiratory effort, Bilateral Air entry equal and Decreased, no Crackles, no wheezes Abdomen: Bowel Sound present, Soft and no tenderness,  Skin: no rashes Extremities: 4+ pedal edema, no calf tenderness, chronic right foot drop Neurologic: without any new focal findings Gait not checked due to patient safety concerns  Vitals:   07/06/23 1215 07/06/23 1232 07/06/23 1306 07/06/23 1550  BP: (!) 141/67 123/61 121/64 117/60  Pulse: 74 75 73 76  Resp: 15 (!) 21 16 16   Temp:   (!) 97.5 F (36.4 C) 98 F (36.7 C)  TempSrc:      SpO2: 97% 94% 92% 93%  Weight:      Height:        Intake/Output Summary (Last 24 hours) at 07/06/2023 1746 Last data filed at 07/06/2023 1415 Gross per 24 hour  Intake 328.35 ml  Output --  Net 328.35 ml   Filed Weights   06/30/23 1800 07/04/23 0837 07/06/23 1034  Weight: 115.2 kg 120.8 kg 120.8 kg    Data Reviewed: I have personally reviewed and interpreted daily labs, tele strips, imagings as discussed above. I reviewed all nursing notes, pharmacy notes, vitals, pertinent old records I have discussed plan of care as described above with RN and patient/family.  CBC: Recent Labs  Lab 07/02/23 0252 07/03/23 0639 07/04/23 0456 07/05/23 0338 07/06/23 0451  WBC 6.4 7.3 7.4 7.0 7.6  NEUTROABS  --   --   --  4.3  --  HGB 9.4* 9.0* 8.3* 8.3* 8.8*  HCT 30.6* 28.7* 26.3* 26.0* 27.5*  MCV 75.9* 74.7* 74.1* 73.4* 72.8*  PLT 317 314 298 315 322   Basic Metabolic Panel: Recent Labs  Lab 07/01/23 0432 07/01/23 1539 07/02/23 0252 07/03/23 0639 07/04/23 0456 07/05/23 0338 07/06/23 0451  NA 138   < > 138 140 138 138 135  K 3.0*   < > 4.3 4.8 4.7 4.7 4.7  CL 109   < > 106 109 108 107 104  CO2 23   < > 24 23 22 22 23   GLUCOSE 127*   < > 142* 121* 73 99 113*  BUN 33*   < > 40* 48* 54* 60* 65*  CREATININE 3.23*   < > 3.40* 3.68* 3.83* 3.65* 3.69*  CALCIUM 8.3*   < > 8.2* 8.5* 8.4* 8.4* 8.5*  MG 1.8  --  1.8 2.0 2.1  --    --   PHOS 4.2  --  4.8* 5.0* 5.1*  --   --    < > = values in this interval not displayed.    Studies: US BIOPSY (KIDNEY)  Result Date: 07/06/2023 INDICATION: 52 year old female with history of proteinuria. EXAM: ULTRASOUND GUIDED RENAL BIOPSY COMPARISON:  None Available. MEDICATIONS: None. ANESTHESIA/SEDATION: Fentanyl 50 mcg IV; Versed 1 mg IV Total Moderate Sedation time: 7 minutes; The patient was continuously monitored during the procedure by the interventional radiology nurse under my direct supervision. COMPLICATIONS: None immediate. PROCEDURE: Informed written consent was obtained from the patient after a discussion of the risks, benefits and alternatives to treatment. The patient understands and consents the procedure. A timeout was performed prior to the initiation of the procedure. Ultrasound scanning was performed of the bilateral flanks. The inferior pole of the left kidney was selected for biopsy due to location and sonographic window. The procedure was planned. The operative site was prepped and draped in the usual sterile fashion. The overlying soft tissues were anesthetized with 1% lidocaine with epinephrine. A 17 gauge coaxial introducer needle was advanced into the inferior cortex of the left kidney and 2 core biopsies were obtained under direct ultrasound guidance with an 18 gauge core biopsy device. Images were saved for documentation purposes. The biopsy device was removed and hemostasis was obtained with injection of Gel-Foam slurry under ultrasound guidance and manual compression. Post procedural scanning was negative for significant post procedural hemorrhage or additional complication. A dressing was placed. The patient tolerated the procedure well without immediate post procedural complication. IMPRESSION: Technically successful ultrasound guided left renal biopsy. Marliss Coots, MD [facility] Electronically Signed   By:  Marliss Coots M.D.   On: 07/06/2023 13:12    Scheduled Meds:  cephALEXin  500 mg Oral Q12H   cyanocobalamin  1,000 mcg Intramuscular Q1200   Followed by   Melene Muller ON 07/09/2023] vitamin B-12  1,000 mcg Oral Daily   [START ON 07/07/2023] enoxaparin (LOVENOX) injection  30 mg Subcutaneous Q24H   feeding supplement (GLUCERNA SHAKE)  237 mL Oral TID BM   folic acid  1 mg Oral Daily   hydrALAZINE  50 mg Oral Q8H   insulin aspart  0-20 Units Subcutaneous TID WC   insulin aspart  0-5 Units Subcutaneous QHS   insulin glargine-yfgn  20 Units Subcutaneous QHS   isosorbide mononitrate  60 mg Oral Daily   metoprolol tartrate  75 mg Oral BID   ondansetron       pantoprazole  20 mg Oral  Daily   rosuvastatin  20 mg Oral Daily   Vitamin D (Ergocalciferol)  50,000 Units Oral Q7 days   Continuous Infusions:  sodium chloride     furosemide (LASIX) 200 mg in dextrose 5 % 100 mL (2 mg/mL) infusion Stopped (07/06/23 1127)   PRN Meds: acetaminophen **OR** acetaminophen, alum & mag hydroxide-simeth, hydrALAZINE, ondansetron, ondansetron **OR** ondansetron (ZOFRAN) IV  Time spent: 35 minutes  Author: Gillis Santa. MD [facility] 07/06/2023 5:46 PM  To reach On-call, see care teams to locate the attending and reach out to them via [email or web address]. If 7PM-7AM, please contact night-coverage If you still have difficulty reaching the attending provider, please page the [facility] (Director on Call) for [facility] on amion for assistance.

## 2023-07-06 NOTE — Progress Notes (Signed)
OT Cancellation Note  Patient Details Name: Shannon Cox MRN: [id number] DOB: 1971-02-03   Cancelled Treatment:    Reason Eval/Treat Not Completed: Fatigue/lethargy limiting ability to participate. Pt sleeping, requires verbal cues and gentle tactile cues to wake but doesn't keep eyes open long. Pt reports not sleeping last night and just fatigued today. Pt declined participation with OT due to fatigue. Encouraged pt to get up to the recliner for her dinner later or go for a walk to minimize functional decline. Pt verbalized understanding. Will re-attempt OT tx.   Arman Filter., MPH, MS, OTR/L ascom [phone] 07/06/23, 4:09 PM

## 2023-07-06 NOTE — Procedures (Signed)
Interventional Radiology Procedure Note  Procedure: Ultrasound guided left kidney biopsy   Findings: Please refer to procedural dictation for full description. Left inferior pole 18 ga core bx x2.  Gelfoam slurry needle track embolization.  Complications: None immediate  Estimated Blood Loss: < 5 mL  Recommendations: Strict 3 hour bedrest. Follow Pathology results.   Marliss Coots, MD

## 2023-07-07 DIAGNOSIS — R7989 Other specified abnormal findings of blood chemistry: Secondary | ICD-10-CM | POA: Diagnosis not present

## 2023-07-07 DIAGNOSIS — N049 Nephrotic syndrome with unspecified morphologic changes: Secondary | ICD-10-CM | POA: Diagnosis not present

## 2023-07-07 DIAGNOSIS — D631 Anemia in chronic kidney disease: Secondary | ICD-10-CM

## 2023-07-07 DIAGNOSIS — N170 Acute kidney failure with tubular necrosis: Secondary | ICD-10-CM | POA: Diagnosis not present

## 2023-07-07 LAB — GLUCOSE, CAPILLARY
Glucose-Capillary: 136 mg/dL — ABNORMAL HIGH (ref 70–99)
Glucose-Capillary: 108 mg/dL — ABNORMAL HIGH (ref 70–99)
Glucose-Capillary: 138 mg/dL — ABNORMAL HIGH (ref 70–99)
Glucose-Capillary: 144 mg/dL — ABNORMAL HIGH (ref 70–99)

## 2023-07-07 LAB — BASIC METABOLIC PANEL
Anion gap: 7 (ref 5–15)
BUN: 67 mg/dL — ABNORMAL HIGH (ref 6–20)
CO2: 24 mmol/L (ref 22–32)
Calcium: 8.3 mg/dL — ABNORMAL LOW (ref 8.9–10.3)
Chloride: 103 mmol/L (ref 98–111)
Creatinine, Ser: 3.9 mg/dL — ABNORMAL HIGH (ref 0.44–1.00)
GFR, Estimated: 13 mL/min — ABNORMAL LOW (ref >60)
Glucose, Bld: 134 mg/dL — ABNORMAL HIGH (ref 70–99)
Potassium: 4.7 mmol/L (ref 3.5–5.1)
Sodium: 134 mmol/L — ABNORMAL LOW (ref 135–145)

## 2023-07-07 MED ORDER — ORAL CARE MOUTH RINSE: 15.0000 mL | OROMUCOSAL | Status: DC | PRN

## 2023-07-07 MED ORDER — TORSEMIDE 20 MG PO TABS
40.0000 mg | ORAL_TABLET | Freq: Two times a day (BID) | ORAL | Status: DC
  Administered 2023-07-07 – 2023-07-08 (×2): 40 mg via ORAL
  Filled 2023-07-07 (×2): qty 2

## 2023-07-07 MED ORDER — PNEUMOCOCCAL 20-VAL CONJ VACC 0.5 ML IM SUSY
0.5000 mL | PREFILLED_SYRINGE | INTRAMUSCULAR | Status: DC
  Filled 2023-07-07: qty 0.5

## 2023-07-07 MED ORDER — INFLUENZA VIRUS VACC SPLIT PF (FLUZONE) 0.5 ML IM SUSY: 0.5000 mL | PREFILLED_SYRINGE | INTRAMUSCULAR | Status: DC

## 2023-07-07 MED ORDER — VITAMIN B-12 1000 MCG PO TABS
1000.0000 ug | ORAL_TABLET | Freq: Every day | ORAL | Status: DC
  Administered 2023-07-07 – 2023-07-08 (×2): 1000 ug via ORAL
  Filled 2023-07-07 (×2): qty 1

## 2023-07-07 NOTE — Plan of Care (Signed)
  Problem: Education: Goal: Ability to demonstrate management of disease process will improve Outcome: Progressing Goal: Ability to verbalize understanding of medication therapies will improve Outcome: Progressing Goal: Individualized Educational Video(s) Outcome: Progressing   Problem: Activity: Goal: Capacity to carry out activities will improve Outcome: Progressing   Problem: Cardiac: Goal: Ability to achieve and maintain adequate cardiopulmonary perfusion will improve Outcome: Progressing   Problem: Clinical Measurements: Goal: Diagnostic test results will improve Outcome: Progressing Goal: Respiratory complications will improve Outcome: Progressing Goal: Cardiovascular complication will be avoided Outcome: Progressing   Problem: Activity: Goal: Risk for activity intolerance will decrease Outcome: Progressing   Problem: Nutrition: Goal: Adequate nutrition will be maintained Outcome: Progressing   Problem: Coping: Goal: Level of anxiety will decrease Outcome: Progressing   Problem: Elimination: Goal: Will not experience complications related to bowel motility Outcome: Progressing Goal: Will not experience complications related to urinary retention Outcome: Progressing   Problem: Pain Managment: Goal: General experience of comfort will improve Outcome: Progressing

## 2023-07-07 NOTE — Progress Notes (Signed)
Physical Therapy Treatment Patient Details Name: Shannon Cox MRN: [id number] DOB: 1971-07-03 Today's Date: 07/07/2023   History of Present Illness Pt is a 52 y.o. 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, ambulant with cane, CKD 3a with anemia of CKD, morbid obesity, recurrent pancreatitis. Pt presents to the ED with a complaint of shortness of breath that acutely worsened on the evening of presentation while lying flat, improved somewhat by sitting upright.    PT Comments  Pt initially reports not wanting to do much siting LEs still swollen and chronic low back pain, however with some cuing and encouragement did agree.  She was slow to get to sitting EOB, but did so w/o physical assist and did get one of 2 of her socks on w/o assist.  Multiple rocking attempt to rise from standard height bed, but did so again w/o direct assist, needing cuing for set up and sequencing only.  Similarly she did not need any direct assist during prolonged ambulation bout but clearly had increasing fatigue with the effort as well as reported (increased) back pain with visible change in guarding posturing with increased distance.  Pt's vitals stable, multiple standing rest breaks and increased UE reliance with increased distance.  Pt will benefit from continued PT, continue with POC.     If plan is discharge home, recommend the following: A little help with walking and/or transfers;A little help with bathing/dressing/bathroom;Assist for transportation;Help with stairs or ramp for entrance   Can travel by private vehicle        Equipment Recommendations  Rolling walker (2 wheels)    Recommendations for Other Services       Precautions / Restrictions Precautions Precautions: Fall Precaution Comments: chronic LBP Restrictions Weight Bearing Restrictions: No     Mobility  Bed Mobility Overal bed mobility:  Needs Assistance Bed Mobility: Supine to Sit, Sit to Supine     Supine to sit: Supervision Sit to supine: Supervision   General bed mobility comments: needed UEs to assist moving LEs in/out of bed but no assist needed from PT    Transfers Overall transfer level: Needs assistance Equipment used: Rolling walker (2 wheels) Transfers: Sit to/from Stand Sit to Stand: Supervision           General transfer comment: multiple attempts with forward lean and heavy UE use from standard height bed, ultimately no assist needed to attain upright standing    Ambulation/Gait Ambulation/Gait assistance: Contact guard assist Gait Distance (Feet): 200 Feet Assistive device: Rolling walker (2 wheels)         General Gait Details: Pt clearly reliant on the walker and needed a few brief standing rest breaks but did ultimately circumambulate the nurses' station w/o need for seated rest break.  b/l foot drop (L >R) with increased hip flexion during swing through.  HR and O2 remain stable and appropriate t/o.   Stairs             Wheelchair Mobility     Tilt Bed    Modified Rankin (Stroke Patients Only)       Balance Overall balance assessment: Needs assistance Sitting-balance support: Feet supported Sitting balance-Leahy Scale: Normal Sitting balance - Comments: able to maintain seated EOB balance during functional activities   Standing balance support: Bilateral upper extremity supported, During functional activity, Reliant on assistive device for balance Standing balance-Leahy Scale: Fair Standing balance comment: heavy reliance on AD; able to maintain  static standing balance during functional activities                            Cognition Arousal: Alert Behavior During Therapy: WFL for tasks assessed/performed Overall Cognitive Status: Within Functional Limits for tasks assessed                                          Exercises       General Comments General comments (skin integrity, edema, etc.): Pt reports increasing back pain with increased time in standing.  Managed the entire loop but with decreasing speed and confidence      Pertinent Vitals/Pain Pain Assessment Pain Assessment: 0-10 Pain Score: 5  Pain Location: low back    Home Living                          Prior Function            PT Goals (current goals can now be found in the care plan section) Progress towards PT goals: Progressing toward goals    Frequency    Min 1X/week      PT Plan      Co-evaluation              AM-PAC PT "6 Clicks" Mobility   Outcome Measure  Help needed turning from your back to your side while in a flat bed without using bedrails?: A Little Help needed moving from lying on your back to sitting on the side of a flat bed without using bedrails?: A Little Help needed moving to and from a bed to a chair (including a wheelchair)?: A Little Help needed standing up from a chair using your arms (e.g., wheelchair or bedside chair)?: A Little Help needed to walk in hospital room?: A Little Help needed climbing 3-5 steps with a railing? : A Lot 6 Click Score: 17    End of Session Equipment Utilized During Treatment: Gait belt Activity Tolerance: Patient limited by fatigue;Patient limited by pain Patient left: in bed;with call bell/phone within reach Nurse Communication: Mobility status PT Visit Diagnosis: Unsteadiness on feet (R26.81);Repeated falls (R29.6);History of falling (Z91.81);Muscle weakness (generalized) (M62.81);Pain Pain - part of body:  (lumbago)     Time: 1478-2956 PT Time Calculation (min) (ACUTE ONLY): 26 min  Charges:    $Gait Training: 8-22 mins $Therapeutic Activity: 8-22 mins PT General Charges $$ ACUTE PT VISIT: 1 Visit                     Malachi Pro, DPT 07/07/2023, 1:34 PM

## 2023-07-07 NOTE — Progress Notes (Signed)
Progress Note   Patient: Shannon Cox ZHY:[id number] DOB: 09/21/1971 DOA: 06/30/2023     6 DOS: the patient was seen and examined on 07/07/2023   Brief hospital course: Shannon Cox is a 52 y.o. 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  She had an upper endoscopy the day prior at [facility] that showed gastritis, biopsies pending.  She reports that her lower extremity edema has been worsening for the past week.    ED course and data review: BP in the ED in the 170s to 180s with pulse in the 90s and otherwise normal vitals.  O2 sats in the high 90s on room air. Labs: Notable for troponin 27 and BNP 250.  WBC normal and hemoglobin 9.6.  Potassium 3.4.  Creatinine 3.33 up from baseline of 1.6. EKG, personally viewed and interpreted showing NSR at 95 with no acute ST-T wave changes. Chest x-ray shows cardiomegaly with no active cardiopulmonary disease Patient treated with IV Lasix 40 mg and hospitalist consulted for admission.    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   Follow biopsy report. Follow nephrology for further recommendation. Creatinine 3.9 today   Acute heart failure  Elevated troponin, suspect demand ischemia History of small pericardial effusion June 2024 Hypertensive urgency/emergency Echo 03/18/2023 showed EF>55% and small pericardial effusion without hemodynamic compromise Given cardiomegaly on chest x-ray will repeat echo to evaluate size of pericardial effusion S/p IV Lasix, Hold ACE/ARB's for now.   Hold amlodipine for possible contribution to lower extremity edema Continue hydralazine 50 mg p.o. 3 times daily, started Imdur 60 mg p.o. daily and metoprolol 75 mg p.o. twice daily Continue Lasix IV infusion with close monitoring of kidney function. Cardiology recommended to continue current treatment and signed off.   E.coli UTI S/p ceftriaxone 1 g IV daily, transitioned to Keflex 500 mg p.o. twice daily for 3 days to complete total 5-day course.   Hypokalemia, potassium repleted cautiously due to CKD 3.  Resolved   Hypertensive urgency, uncontrolled blood pressure Increase hydralazine 50 mg p.o. 3 times daily 9/28 increased metoprolol 75 mg p.o. twice daily  9/28 increased Imdur 60 mg p.o. daily On Lasix IV infusion 10/1 blood pressure is well-controlled now Monitor BP and titrate medications accordingly   Diabetic neuropathy (HCC) Multimodal pain control.  Avoid NSAIDs   Obesity, Class III, BMI 40-49.9 (morbid obesity) (HCC) Complicating factor to overall prognosis and care  Insulin dependent type 2 diabetes mellitus.  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 Follow-up with PCP to repeat folic acid level after 3 to 6 months.   Vitamin B12 level 194, goal >400, started vitamin B12 1000 mcg IM injection daily during hospital stay followed by oral supplement. Follow-up with PCP to repeat vitamin B12 level after 3 to 6 months.   Vitamin D insufficiency, started vitamin D 50,000 units p.o. weekly.  Follow-up with PCP to repeat vitamin D level after 3 to 6 months   Obesity Body mass index is 44.32 kg/m.  Diet, exercise and weight reduction advised. Suspected OSA - awaiting sleep study results.     Out of bed to chair. Incentive spirometry. Nursing supportive care. Fall, aspiration precautions. DVT prophylaxis   Code Status: Full Code  Subjective: Patient is seen and examined today morning.  She denies any complaints of chest pain, shortness of breath.  Able to get out of bed.  She does have pitting pedal edema states this is improving.  Eating fair, wishes to go home.  Physical Exam: Vitals:   07/07/23 0744 07/07/23 1216 07/07/23 1328 07/07/23 1627  BP: (!) 150/68 (!) 149/63 (!) 130/56 (!) 125/59  Pulse: 81 74  73  Resp: 20 18  20   Temp: (!) 97.4 F (36.3 C) 98.1 F (36.7 C)  98.1 F (36.7 C)  TempSrc:      SpO2: 93% 97%  96%  Weight: 119.7 kg     Height:        General -  Middle aged morbidly obese African-American female, no apparent distress HEENT - PERRLA, EOMI, atraumatic head, non tender sinuses. Lung - Clear, bibasilar rales, diffuse rhonchi, no wheezes. Heart - S1, S2 heard, no murmurs, rubs, 3+ pedal edema. Abdomen - Soft, non tender nondistended, bowel sounds good Neuro - Alert, awake and oriented x 3, non focal exam. Skin - Warm and dry.  Data Reviewed:      Latest Ref Rng & Units 07/06/2023    4:51 AM 07/05/2023    3:38 AM 07/04/2023    4:56 AM  CBC  WBC 4.0 - 10.5 K/uL 7.6  7.0  7.4   Hemoglobin 12.0 -  15.0 g/dL 8.8  8.3  8.3   Hematocrit 36.0 - 46.0 % 27.5  26.0  26.3   Platelets 150 - 400 K/uL 322  315  298       Latest Ref Rng & Units 07/07/2023    9:11 AM 07/06/2023    4:51 AM 07/05/2023    3:38 AM  BMP  Glucose 70 - 99 mg/dL 578  469  99   BUN 6 - 20 mg/dL 67  65  60   Creatinine 0.44 - 1.00 mg/dL 6.29  5.28  4.13   Sodium 135 - 145 mmol/L 134  135  138   Potassium 3.5 - 5.1 mmol/L 4.7  4.7  4.7   Chloride 98 - 111 mmol/L 103  104  107   CO2 22 - 32 mmol/L 24  23  22    Calcium 8.9 - 10.3 mg/dL 8.3  8.5  8.4    US BIOPSY (KIDNEY)  Result Date: 07/06/2023 INDICATION: 52 year old female with history of proteinuria. EXAM: ULTRASOUND GUIDED RENAL BIOPSY COMPARISON:  None Available. MEDICATIONS: None. ANESTHESIA/SEDATION: Fentanyl 50 mcg IV;  Versed 1 mg IV Total Moderate Sedation time: 7 minutes; The patient was continuously monitored during the procedure by the interventional radiology nurse under my direct supervision. COMPLICATIONS: None immediate. PROCEDURE: Informed written consent was obtained from the patient after a discussion of the risks, benefits and alternatives to treatment. The patient understands and consents the procedure. A timeout was performed prior to the initiation of the procedure. Ultrasound scanning was performed of the bilateral flanks. The inferior pole of the left kidney was selected for biopsy due to location and sonographic window. The procedure was planned. The operative site was prepped and draped in the usual sterile fashion. The overlying soft tissues were anesthetized with 1% lidocaine with epinephrine. A 17 gauge coaxial introducer needle was advanced into the inferior cortex of the left kidney and 2 core biopsies were obtained under direct ultrasound guidance with an 18 gauge core biopsy device. Images were saved for documentation purposes. The biopsy device was removed and hemostasis was obtained with injection of Gel-Foam slurry under ultrasound guidance and  manual compression. Post procedural scanning was negative for significant post procedural hemorrhage or additional complication. A dressing was placed. The patient tolerated the procedure well without immediate post procedural complication. IMPRESSION: Technically successful ultrasound guided left renal biopsy. Marliss Coots, MD [facility] Radiology Electronically Signed   By: Marliss Coots M.D.   On: 07/06/2023 13:12     Family Communication: Discussed with patient, she understands and agrees. All questions answereed.    Disposition: Status is: Inpatient Remains inpatient appropriate because: worsening renal function, on IV diuresis.  Planned Discharge Destination: Home     Time spent: 39 minutes  Author: Marcelino Duster, MD 07/07/2023 5:08 PM Secure chat 7am to 7pm For on call review [email or web address].

## 2023-07-07 NOTE — Plan of Care (Signed)

## 2023-07-07 NOTE — Progress Notes (Signed)
OT Cancellation Note  Patient Details Name: Shannon Cox MRN: [id number] DOB: 05-04-1971   Cancelled Treatment:    Reason Eval/Treat Not Completed: Other (comment). On arrival pt seated EOB, reports feeling at baseline with ADLs, agreeable for OT to sign off at this time. Please re-consult if new needs arise.    Kathie Dike, M.S. OTR/L  07/07/23, 11:06 AM  ascom [phone]

## 2023-07-07 NOTE — Progress Notes (Signed)
[facility]  ROUNDING NOTE   Subjective:   Laying in bed  Tolerating meals Denies nausea or vomiting Lower extremity edema improving  Furosemide drip    Objective:  Vital signs in last 24 hours:  Temp:  [97.4 F (36.3 C)-98.8 F (37.1 C)] 98.1 F (36.7 C) (10/02 1216) Pulse Rate:  [73-82] 74 (10/02 1216) Resp:  [16-20] 18 (10/02 1216) BP: (117-150)/(59-79) 149/63 (10/02 1216) SpO2:  [92 %-100 %] 97 % (10/02 1216) Weight:  [119.7 kg] 119.7 kg (10/02 0744)  Weight change:  Filed Weights   07/04/23 0837 07/06/23 1034 07/07/23 0744  Weight: 120.8 kg 120.8 kg 119.7 kg    Intake/Output: I/O last 3 completed shifts: In: 1041.1 [P.O.:980; I.V.:61.1] Out: 400 [Urine:400]   Intake/Output this shift:  Total I/O In: 309.5 [P.O.:300; I.V.:9.5] Out: 700 [Urine:700]  Physical Exam: General: NAD  Head: Normocephalic, atraumatic. Moist oral mucosal membranes  Eyes: Anicteric  Lungs:  Clear to auscultation, normal effort  Heart: Regular rate and rhythm  Abdomen:  Soft, nontender  Extremities:  2+ peripheral edema.  Neurologic: Alert and oriented, moving all four extremities  Skin: No lesions  Access: None    Basic Metabolic Panel: Recent Labs  Lab 07/01/23 0432 07/01/23 1539 07/02/23 0252 07/03/23 0639 07/04/23 0456 07/05/23 0338 07/06/23 0451 07/07/23 0911  NA 138   < > 138 140 138 138 135 134*  K 3.0*   < > 4.3 4.8 4.7 4.7 4.7 4.7  CL 109   < > 106 109 108 107 104 103  CO2 23   < > 24 23 22 22 23 24   GLUCOSE 127*   < > 142* 121* 73 99 113* 134*  BUN 33*   < > 40* 48* 54* 60* 65* 67*  CREATININE 3.23*   < > 3.40* 3.68* 3.83* 3.65* 3.69* 3.90*  CALCIUM 8.3*   < > 8.2* 8.5* 8.4* 8.4* 8.5* 8.3*  MG 1.8  --  1.8 2.0 2.1  --   --   --   PHOS 4.2  --  4.8* 5.0* 5.1*  --   --   --    < > = values in this interval not displayed.    Liver Function Tests: Recent Labs  Lab 06/30/23 2228 07/01/23 0432 07/05/23 0338  AST 13* 11* 12*  ALT 11 10 11    ALKPHOS 75 76 68  BILITOT 0.6 0.2* 0.4  PROT 6.4* 6.2* 5.6*  ALBUMIN 2.2* 2.1* 2.1*   No results for input(s): "LIPASE", "AMYLASE" in the last 168 hours. No results for input(s): "AMMONIA" in the last 168 hours.  CBC: Recent Labs  Lab 07/02/23 0252 07/03/23 0639 07/04/23 0456 07/05/23 0338 07/06/23 0451  WBC 6.4 7.3 7.4 7.0 7.6  NEUTROABS  --   --   --  4.3  --   HGB 9.4* 9.0* 8.3* 8.3* 8.8*  HCT 30.6* 28.7* 26.3* 26.0* 27.5*  MCV 75.9* 74.7* 74.1* 73.4* 72.8*  PLT 317 314 298 315 322    Cardiac Enzymes: No results for input(s): "CKTOTAL", "CKMB", "CKMBINDEX", "TROPONINI" in the last 168 hours.  BNP: Invalid input(s): "POCBNP"  CBG: Recent Labs  Lab 07/06/23 1202 07/06/23 1744 07/06/23 2021 07/07/23 0741 07/07/23 1213  GLUCAP 80 152* 132* 136* 108*    Microbiology: Results for orders placed or performed during the hospital encounter of 06/30/23  Urine Culture (for pregnant, neutropenic or urologic patients or patients with an indwelling urinary catheter)     Status: Abnormal   Collection  Time: 07/01/23  4:32 AM   Specimen: Urine, Clean Catch  Result Value Ref Range Status   Specimen Description   Final    URINE, CLEAN CATCH Performed at [facility], [address] [place], [place] [address]    Special Requests   Final    NONE Performed at [facility], [address] [place], [place] [address]    Culture >=100,000 COLONIES/mL ESCHERICHIA COLI (A)  Final   Report Status 07/03/2023 FINAL  Final   Organism ID, Bacteria ESCHERICHIA COLI (A)  Final      Susceptibility   Escherichia coli - MIC*    AMPICILLIN >=32 RESISTANT Resistant     CEFAZOLIN <=4 SENSITIVE Sensitive     CEFEPIME <=0.12 SENSITIVE Sensitive     CEFTRIAXONE <=0.25 SENSITIVE Sensitive     CIPROFLOXACIN <=0.25 SENSITIVE Sensitive     GENTAMICIN <=1 SENSITIVE Sensitive     IMIPENEM <=0.25 SENSITIVE Sensitive     NITROFURANTOIN <=16 SENSITIVE Sensitive      TRIMETH/SULFA <=20 SENSITIVE Sensitive     AMPICILLIN/SULBACTAM 4 SENSITIVE Sensitive     PIP/TAZO <=4 SENSITIVE Sensitive     * >=100,000 COLONIES/mL ESCHERICHIA COLI    Coagulation Studies: Recent Labs    07/04/23 1522  LABPROT 14.3  INR 1.1    Urinalysis: No results for input(s): "COLORURINE", "LABSPEC", "PHURINE", "GLUCOSEU", "HGBUR", "BILIRUBINUR", "KETONESUR", "PROTEINUR", "UROBILINOGEN", "NITRITE", "LEUKOCYTESUR" in the last 72 hours.  Invalid input(s): "APPERANCEUR"     Imaging: US BIOPSY (KIDNEY)  Result Date: 07/06/2023 INDICATION: 52 year old female with history of proteinuria. EXAM: ULTRASOUND GUIDED RENAL BIOPSY COMPARISON:  None Available. MEDICATIONS: None. ANESTHESIA/SEDATION: Fentanyl 50 mcg IV; Versed 1 mg IV Total Moderate Sedation time: 7 minutes; The patient was continuously monitored during the procedure by the interventional radiology nurse under my direct supervision. COMPLICATIONS: None immediate. PROCEDURE: Informed written consent was obtained from the patient after a discussion of the risks, benefits and alternatives to treatment. The patient understands and consents the procedure. A timeout was performed prior to the initiation of the procedure. Ultrasound scanning was performed of the bilateral flanks. The inferior pole of the left kidney was selected for biopsy due to location and sonographic window. The procedure was planned. The operative site was prepped and draped in the usual sterile fashion. The overlying soft tissues were anesthetized with 1% lidocaine with epinephrine. A 17 gauge coaxial introducer needle was advanced into the inferior cortex of the left kidney and 2 core biopsies were obtained under direct ultrasound guidance with an 18 gauge core biopsy device. Images were saved for documentation purposes. The biopsy device was removed and hemostasis was obtained with injection of Gel-Foam slurry under ultrasound guidance and manual compression. Post  procedural scanning was negative for significant post procedural hemorrhage or additional complication. A dressing was placed. The patient tolerated the procedure well without immediate post procedural complication. IMPRESSION: Technically successful ultrasound guided left renal biopsy. Marliss Coots, MD [facility] Radiology Electronically Signed   By: Marliss Coots M.D.   On: 07/06/2023 13:12     Medications:    sodium chloride      vitamin B-12  1,000 mcg Oral Daily   enoxaparin (LOVENOX) injection  30 mg Subcutaneous Q24H   feeding supplement (GLUCERNA SHAKE)  237 mL Oral TID BM   folic acid  1 mg Oral Daily   hydrALAZINE  50 mg Oral Q8H   [START ON 07/08/2023] influenza vac split trivalent PF  0.5 mL Intramuscular  Tomorrow-1000   insulin aspart  0-20 Units Subcutaneous TID WC   insulin aspart  0-5 Units Subcutaneous QHS   insulin glargine-yfgn  20 Units Subcutaneous QHS   isosorbide mononitrate  60 mg Oral Daily   metoprolol tartrate  75 mg Oral BID   pantoprazole  20 mg Oral Daily   [START ON 07/08/2023] pneumococcal 20-valent conjugate vaccine  0.5 mL Intramuscular Tomorrow-1000   rosuvastatin  20 mg Oral Daily   torsemide  40 mg Oral BID   Vitamin D (Ergocalciferol)  50,000 Units Oral Q7 days   acetaminophen **OR** acetaminophen, alum & mag hydroxide-simeth, hydrALAZINE, ondansetron **OR** ondansetron (ZOFRAN) IV, mouth rinse  Assessment/ Plan:  Ms. Shannon Cox is a 52 y.o.  female with medical problems of hypertension, insulin-dependent diabetes with neuropathy and retinopathy, foot drop, chronic kidney disease, anemia, morbid obesity, recurrent pancreatitis, was admitted on 06/30/2023 for Shortness of breath [R06.02] Acute CHF (HCC) [I50.9] AKI (acute kidney injury) (HCC) [N17.9]   Acute kidney injury on chronic kidney disease stage IIIb. Underlying CKD risk factors include poorly controlled diabetes, hypertension,  obesity, use of nonsteroidals. Baseline creatinine of 1.85, GFR 33 from 04/14/2023.  Urine protein to creatinine ratio of 13 g. Empagliflozin, valsartan on hold.  Renal function elevated today. Continues to have good urine output with diuresis. Patient will need to follow up with   Lab Results  Component Value Date   CREATININE 3.90 (H) 07/07/2023   CREATININE 3.69 (H) 07/06/2023   CREATININE 3.65 (H) 07/05/2023    Intake/Output Summary (Last 24 hours) at 07/07/2023 1304 Last data filed at 07/07/2023 1123 Gross per 24 hour  Intake 1350.61 ml  Output 1100 ml  Net 250.61 ml   2. Diabetes type 2 with CKD Most recent hemoglobin A1c 7.1%. Current regimen includes insulin Semglee  Primary team to manage    3. Nephrotic proteinuria Urine protein to creatinine ratio of 13 g as outpatient. Serologies negative.  Most likely it is related to diabetes.  Appreciate IR completing renal biopsy on 07/06/23. Sample sent to [facility] and expect preliminary results in 24-48hrs with final report in 5 days.    4. Hypertension with lower extremity edema  Continue hydralazine and metoprolol for now. Blood pressure 149/63. Will order Torsemide 40mg  BID and Metolazone 5mg  twice a week at discharge.     LOS: 6 Richards Pherigo 10/2/20241:04 PM

## 2023-07-08 ENCOUNTER — Encounter: Payer: BLUE CROSS/BLUE SHIELD | Admitting: Family

## 2023-07-08 DIAGNOSIS — N170 Acute kidney failure with tubular necrosis: Secondary | ICD-10-CM | POA: Diagnosis not present

## 2023-07-08 DIAGNOSIS — G4733 Obstructive sleep apnea (adult) (pediatric): Secondary | ICD-10-CM | POA: Diagnosis not present

## 2023-07-08 DIAGNOSIS — N1832 Chronic kidney disease, stage 3b: Secondary | ICD-10-CM | POA: Diagnosis not present

## 2023-07-08 LAB — GLUCOSE, CAPILLARY
Glucose-Capillary: 179 mg/dL — ABNORMAL HIGH (ref 70–99)
Glucose-Capillary: 74 mg/dL (ref 70–99)

## 2023-07-08 LAB — BASIC METABOLIC PANEL
Anion gap: 8 (ref 5–15)
BUN: 65 mg/dL — ABNORMAL HIGH (ref 6–20)
CO2: 24 mmol/L (ref 22–32)
Calcium: 8.5 mg/dL — ABNORMAL LOW (ref 8.9–10.3)
Chloride: 104 mmol/L (ref 98–111)
Creatinine, Ser: 3.79 mg/dL — ABNORMAL HIGH (ref 0.44–1.00)
GFR, Estimated: 14 mL/min — ABNORMAL LOW (ref >60)
Glucose, Bld: 156 mg/dL — ABNORMAL HIGH (ref 70–99)
Potassium: 4.5 mmol/L (ref 3.5–5.1)
Sodium: 136 mmol/L (ref 135–145)

## 2023-07-08 LAB — CBC
HCT: 29.1 % — ABNORMAL LOW (ref 36.0–46.0)
Hemoglobin: 9 g/dL — ABNORMAL LOW (ref 12.0–15.0)
MCH: 23.3 pg — ABNORMAL LOW (ref 26.0–34.0)
MCHC: 30.9 g/dL (ref 30.0–36.0)
MCV: 75.4 fL — ABNORMAL LOW (ref 80.0–100.0)
Platelets: 334 10*3/uL (ref 150–400)
RBC: 3.86 MIL/uL — ABNORMAL LOW (ref 3.87–5.11)
RDW: 19.8 % — ABNORMAL HIGH (ref 11.5–15.5)
WBC: 7 10*3/uL (ref 4.0–10.5)
nRBC: 0 % (ref 0.0–0.2)

## 2023-07-08 MED ORDER — METOLAZONE 2.5 MG PO TABS: 2.5000 mg | ORAL_TABLET | ORAL | 2 refills | Status: DC

## 2023-07-08 MED ORDER — ROSUVASTATIN CALCIUM 20 MG PO TABS: 20.0000 mg | ORAL_TABLET | Freq: Every day | ORAL | 2 refills | Status: AC

## 2023-07-08 MED ORDER — ISOSORBIDE MONONITRATE ER 60 MG PO TB24: 60.0000 mg | ORAL_TABLET | Freq: Every day | ORAL | 2 refills | Status: DC

## 2023-07-08 MED ORDER — METOLAZONE 2.5 MG PO TABS
2.5000 mg | ORAL_TABLET | Freq: Once | ORAL | Status: AC
  Administered 2023-07-08: 2.5 mg via ORAL
  Filled 2023-07-08: qty 1

## 2023-07-08 MED ORDER — CYANOCOBALAMIN 1000 MCG PO TABS: 1000.0000 ug | ORAL_TABLET | Freq: Every day | ORAL | 2 refills | Status: DC

## 2023-07-08 MED ORDER — FOLIC ACID 1 MG PO TABS: 1.0000 mg | ORAL_TABLET | Freq: Every day | ORAL | 2 refills | Status: AC

## 2023-07-08 MED ORDER — METOPROLOL TARTRATE 75 MG PO TABS: 75.0000 mg | ORAL_TABLET | Freq: Two times a day (BID) | ORAL | 2 refills | Status: AC

## 2023-07-08 MED ORDER — TORSEMIDE 40 MG PO TABS: 40.0000 mg | ORAL_TABLET | Freq: Two times a day (BID) | ORAL | 2 refills | Status: DC

## 2023-07-08 NOTE — Progress Notes (Signed)
     [facility] REFERRAL        Occupational Therapy * Physical Therapy * Speech Therapy                           DATE 07/08/23  PATIENT NAME Saki Legore PATIENT MRN [id number]       DIAGNOSIS/DIAGNOSIS CODE N17.9  DATE OF DISCHARGE: 07/08/23      Dear Provider (Name: [facility] outpatient __  Fax: [phone]   I certify that I have examined this patient and that occupational/physical/speech therapy is necessary on an outpatient basis.    The patient has expressed interest in completing their recommended course of therapy at your  location.  Once a formal order from the patient's primary care physician has been obtained, please  contact him/her to schedule an appointment for evaluation at your earliest convenience.   [ X]  Physical Therapy Evaluate and Treat  [  ]  Occupational Therapy Evaluate and Treat  [  ]  Speech Therapy Evaluate and Treat         The patient's primary care physician (listed above) must furnish and be responsible for a formal order such that the recommended services may be furnished while under the primary physician's care, and that the plan of care will be established and reviewed every 30 days (or more often if condition necessitates).    Darolyn Rua, [place], MSW, [place] [phone]

## 2023-07-08 NOTE — Progress Notes (Signed)
Trilby Leaver Admire  A and O x 4. VSS. Pt tolerating diet well. No complaints of pain or nausea. IV removed intact, prescriptions given. Pt voiced understanding of discharge instructions with no further questions. Pt discharged via wheelchair with axillary.    Allergies as of 07/08/2023       Reactions   Insulins    Levimir, skin reaction.    Sulfa Antibiotics Rash        Medication List     STOP taking these medications    amLODipine 10 MG tablet Commonly known as: NORVASC   amLODipine 5 MG tablet Commonly known as: NORVASC   empagliflozin 25 MG Tabs tablet Commonly known as: JARDIANCE   furosemide 20 MG tablet Commonly known as: Lasix   HYDROcodone-acetaminophen 5-325 MG tablet Commonly known as: Norco   oxyCODONE-acetaminophen 5-325 MG tablet Commonly known as: Percocet   valsartan 320 MG tablet Commonly known as: DIOVAN       TAKE these medications    cetirizine 10 MG tablet Commonly known as: ZYRTEC Take 10 mg by mouth daily.   Cholecalciferol 1.25 MG (50000 UT) capsule Take 50,000 Units by mouth once a week.   cyanocobalamin 1000 MCG tablet Take 1 tablet (1,000 mcg total) by mouth daily. Start taking on: July 09, 2023   diclofenac Sodium 1 % Gel Commonly known as: VOLTAREN Apply 2 g topically 4 (four) times daily.   fluticasone 50 MCG/ACT nasal spray Commonly known as: FLONASE Place 2 sprays into both nostrils daily.   folic acid 1 MG tablet Commonly known as: FOLVITE Take 1 tablet (1 mg total) by mouth daily. Start taking on: July 09, 2023   glipiZIDE 5 MG 24 hr tablet Commonly known as: GLUCOTROL XL Take 1 tablet by mouth 2 (two) times daily.   hydrALAZINE 25 MG tablet Commonly known as: APRESOLINE Take 1 tablet by mouth 2 (two) times daily with a meal.   isosorbide mononitrate 60 MG 24 hr tablet Commonly known as: IMDUR Take 1 tablet (60 mg total) by mouth daily. Start taking on: July 09, 2023   lidocaine 2 %  solution Commonly known as: XYLOCAINE Use as directed 15 mLs in the mouth or throat as needed for mouth pain.   meclizine 25 MG tablet Commonly known as: ANTIVERT Take 1 tablet (25 mg total) by mouth 3 (three) times daily as needed for dizziness.   metolazone 2.5 MG tablet Commonly known as: ZAROXOLYN Take 1 tablet (2.5 mg total) by mouth 2 (two) times a week.   Metoprolol Tartrate 75 MG Tabs Take 1 tablet (75 mg total) by mouth 2 (two) times daily.   ondansetron 4 MG disintegrating tablet Commonly known as: ZOFRAN-ODT Allow 1-2 tablets to dissolve in your mouth every 8 hours as needed for nausea/vomiting   pantoprazole 20 MG tablet Commonly known as: Protonix Take 1 tablet (20 mg total) by mouth daily.   potassium chloride SA 20 MEQ tablet Commonly known as: KLOR-CON M Take 1 tablet (20 mEq total) by mouth daily for 5 days.   rosuvastatin 20 MG tablet Commonly known as: CRESTOR Take 1 tablet (20 mg total) by mouth daily. Start taking on: July 09, 2023   Torsemide 40 MG Tabs Take 40 mg by mouth 2 (two) times daily.   Evaristo Bury FlexTouch 200 UNIT/ML FlexTouch Pen Generic drug: insulin degludec Inject 25 Units into the skin daily.        Vitals:   07/08/23 0827 07/08/23 1221  BP: (!) 166/74 (!) 152/73  Pulse: 72 70  Resp: 18 18  Temp: 97.7 F (36.5 C) 97.9 F (36.6 C)  SpO2: 98% 91%    Suzzanne Cloud

## 2023-07-08 NOTE — Discharge Summary (Signed)
Physician Discharge Summary   Patient: Shannon Cox MRN: [id number] DOB: 07-04-1971  Admit date:     06/30/2023  Discharge date: 07/08/23  Discharge Physician: Marcelino Duster   PCP: Pcp, No   Recommendations at discharge:    PCP follow up in 1 week. Primary nephrology follow up for biopsy results, and further management.  Discharge Diagnoses: Principal Problem:   Acute renal failure superimposed on stage 3b chronic kidney disease (HCC) Active Problems:   Acute heart failure (HCC)   Anemia due to stage 3b chronic kidney disease (HCC)   Insulin dependent type 2 diabetes mellitus (HCC)   Obesity, Class III, BMI 40-49.9 (morbid obesity) (HCC)   Elevated troponin   Acute CHF (HCC)   Diabetic neuropathy (HCC)   OSA (obstructive sleep apnea)   Shortness of breath   Nephrotic syndrome  Resolved Problems:   * No resolved hospital problems. [facility] Course: As per HPI-Shannon Cox is a 52 y.o. 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  She had an upper endoscopy the day prior at [facility] that showed gastritis, biopsies pending.  She reports that her lower extremity edema has been worsening for the past week.    ED course and data review: BP in the ED in the 170s to 180s with pulse in the 90s and otherwise normal vitals.  O2  sats in the high 90s on room air. Labs: Notable for troponin 27 and BNP 250.  WBC normal and hemoglobin 9.6.  Potassium 3.4.  Creatinine 3.33 up from baseline of 1.6. EKG, personally viewed and interpreted showing NSR at 95 with no acute ST-T wave changes. Chest x-ray shows cardiomegaly with no active cardiopulmonary disease Patient treated with IV Lasix 40 mg and hospitalist consulted for admission.   Assessment and Plan: Nephrotic syndrome Acute renal failure superimposed on stage 3b chronic kidney disease Anemia of CKD 3b Baseline creatinine 1.6 Held po Lasix, valsartan and Jardiance Home meds on admission Patient had an outpatient  nephrology referral for workup for nephrotic syndrome(had SPEP UPEP done while at [facility]) Urinalysis positive for UTI, urine protein> 3000, nephrotic range Patient does not take aspirin at home, she was given only 1 dose on 9/26 Nephrology consulted, recommended renal biopsy, IR consulted, s/p left renal biopsy done 10/1.   Follow biopsy report. Follow primary nephrology clinic for further recommendation. Creatinine 3.79 today   Acute heart failure  Elevated troponin, suspect demand ischemia History of small pericardial effusion June 2024 Hypertensive urgency/emergency Echo 03/18/2023 showed EF>55% and small pericardial effusion without hemodynamic compromise Given cardiomegaly on chest x-ray will repeat echo to evaluate size of pericardial effusion S/p IV Lasix drip, Hold ACE/ARB's for now.   Hold amlodipine for possible contribution to lower extremity edema Continue hydralazine 50 mg p.o. 3 times daily, started Imdur 60 mg p.o. daily and metoprolol 75 mg p.o. twice daily. Cardiology advised to continue diuresis. Continued on  Physician Discharge Summary   Patient: Shannon Cox MRN: [id number] DOB: 07-04-1971  Admit date:     06/30/2023  Discharge date: 07/08/23  Discharge Physician: Marcelino Duster   PCP: Pcp, No   Recommendations at discharge:    PCP follow up in 1 week. Primary nephrology follow up for biopsy results, and further management.  Discharge Diagnoses: Principal Problem:   Acute renal failure superimposed on stage 3b chronic kidney disease (HCC) Active Problems:   Acute heart failure (HCC)   Anemia due to stage 3b chronic kidney disease (HCC)   Insulin dependent type 2 diabetes mellitus (HCC)   Obesity, Class III, BMI 40-49.9 (morbid obesity) (HCC)   Elevated troponin   Acute CHF (HCC)   Diabetic neuropathy (HCC)   OSA (obstructive sleep apnea)   Shortness of breath   Nephrotic syndrome  Resolved Problems:   * No resolved hospital problems. [facility] Course: As per HPI-Shannon Cox is a 52 y.o. 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  She had an upper endoscopy the day prior at [facility] that showed gastritis, biopsies pending.  She reports that her lower extremity edema has been worsening for the past week.    ED course and data review: BP in the ED in the 170s to 180s with pulse in the 90s and otherwise normal vitals.  O2  sats in the high 90s on room air. Labs: Notable for troponin 27 and BNP 250.  WBC normal and hemoglobin 9.6.  Potassium 3.4.  Creatinine 3.33 up from baseline of 1.6. EKG, personally viewed and interpreted showing NSR at 95 with no acute ST-T wave changes. Chest x-ray shows cardiomegaly with no active cardiopulmonary disease Patient treated with IV Lasix 40 mg and hospitalist consulted for admission.   Assessment and Plan: Nephrotic syndrome Acute renal failure superimposed on stage 3b chronic kidney disease Anemia of CKD 3b Baseline creatinine 1.6 Held po Lasix, valsartan and Jardiance Home meds on admission Patient had an outpatient  nephrology referral for workup for nephrotic syndrome(had SPEP UPEP done while at [facility]) Urinalysis positive for UTI, urine protein> 3000, nephrotic range Patient does not take aspirin at home, she was given only 1 dose on 9/26 Nephrology consulted, recommended renal biopsy, IR consulted, s/p left renal biopsy done 10/1.   Follow biopsy report. Follow primary nephrology clinic for further recommendation. Creatinine 3.79 today   Acute heart failure  Elevated troponin, suspect demand ischemia History of small pericardial effusion June 2024 Hypertensive urgency/emergency Echo 03/18/2023 showed EF>55% and small pericardial effusion without hemodynamic compromise Given cardiomegaly on chest x-ray will repeat echo to evaluate size of pericardial effusion S/p IV Lasix drip, Hold ACE/ARB's for now.   Hold amlodipine for possible contribution to lower extremity edema Continue hydralazine 50 mg p.o. 3 times daily, started Imdur 60 mg p.o. daily and metoprolol 75 mg p.o. twice daily. Cardiology advised to continue diuresis. Continued on  Physician Discharge Summary   Patient: Shannon Cox MRN: [id number] DOB: 07-04-1971  Admit date:     06/30/2023  Discharge date: 07/08/23  Discharge Physician: Marcelino Duster   PCP: Pcp, No   Recommendations at discharge:    PCP follow up in 1 week. Primary nephrology follow up for biopsy results, and further management.  Discharge Diagnoses: Principal Problem:   Acute renal failure superimposed on stage 3b chronic kidney disease (HCC) Active Problems:   Acute heart failure (HCC)   Anemia due to stage 3b chronic kidney disease (HCC)   Insulin dependent type 2 diabetes mellitus (HCC)   Obesity, Class III, BMI 40-49.9 (morbid obesity) (HCC)   Elevated troponin   Acute CHF (HCC)   Diabetic neuropathy (HCC)   OSA (obstructive sleep apnea)   Shortness of breath   Nephrotic syndrome  Resolved Problems:   * No resolved hospital problems. [facility] Course: As per HPI-Shannon Cox is a 52 y.o. 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  She had an upper endoscopy the day prior at [facility] that showed gastritis, biopsies pending.  She reports that her lower extremity edema has been worsening for the past week.    ED course and data review: BP in the ED in the 170s to 180s with pulse in the 90s and otherwise normal vitals.  O2  sats in the high 90s on room air. Labs: Notable for troponin 27 and BNP 250.  WBC normal and hemoglobin 9.6.  Potassium 3.4.  Creatinine 3.33 up from baseline of 1.6. EKG, personally viewed and interpreted showing NSR at 95 with no acute ST-T wave changes. Chest x-ray shows cardiomegaly with no active cardiopulmonary disease Patient treated with IV Lasix 40 mg and hospitalist consulted for admission.   Assessment and Plan: Nephrotic syndrome Acute renal failure superimposed on stage 3b chronic kidney disease Anemia of CKD 3b Baseline creatinine 1.6 Held po Lasix, valsartan and Jardiance Home meds on admission Patient had an outpatient  nephrology referral for workup for nephrotic syndrome(had SPEP UPEP done while at [facility]) Urinalysis positive for UTI, urine protein> 3000, nephrotic range Patient does not take aspirin at home, she was given only 1 dose on 9/26 Nephrology consulted, recommended renal biopsy, IR consulted, s/p left renal biopsy done 10/1.   Follow biopsy report. Follow primary nephrology clinic for further recommendation. Creatinine 3.79 today   Acute heart failure  Elevated troponin, suspect demand ischemia History of small pericardial effusion June 2024 Hypertensive urgency/emergency Echo 03/18/2023 showed EF>55% and small pericardial effusion without hemodynamic compromise Given cardiomegaly on chest x-ray will repeat echo to evaluate size of pericardial effusion S/p IV Lasix drip, Hold ACE/ARB's for now.   Hold amlodipine for possible contribution to lower extremity edema Continue hydralazine 50 mg p.o. 3 times daily, started Imdur 60 mg p.o. daily and metoprolol 75 mg p.o. twice daily. Cardiology advised to continue diuresis. Continued on  prepped and draped in the usual sterile fashion. The overlying soft tissues were anesthetized with 1% lidocaine with epinephrine. A 17 gauge coaxial introducer needle was advanced into the inferior cortex of the left kidney and 2 core biopsies were obtained under direct ultrasound guidance with an 18 gauge core biopsy device. Images were saved for documentation purposes. The biopsy device was removed and hemostasis was obtained with injection of Gel-Foam slurry under ultrasound guidance and manual compression. Post procedural scanning was negative for significant post procedural hemorrhage or additional complication. A dressing was placed. The patient tolerated the procedure well without immediate post procedural complication. IMPRESSION: Technically successful ultrasound guided left renal biopsy. Marliss Coots, MD [facility] Radiology Electronically Signed   By: Marliss Coots M.D.   On: 07/06/2023 13:12   ECHOCARDIOGRAM COMPLETE  Result Date: 07/01/2023    ECHOCARDIOGRAM REPORT   Patient Name:   Shannon Cox Date of Exam: 07/01/2023 Medical Rec #:  [id number]             Height:       65.0 in Accession #:    [id number]            Weight:        254.0 lb Date of Birth:  [birth year]              BSA:          2.190 m Patient Age:    52 years              BP:           189/111 mmHg Patient Gender: F                     HR:           93 bpm. Exam Location:  [facility] Procedure: 2D Echo, Cardiac Doppler and Color Doppler Indications:     CHF I50.31  History:         Patient has prior history of Echocardiogram examinations, most                  recent 03/18/2023. Elevated Troponin; CKD3a; Risk                  Factors:Diabetes, Non-Smoker, Sleep Apnea and Hypertension.  Sonographer:     Dondra Prader RVT RCS Referring Phys:  [id number] Andris Baumann Diagnosing Phys: Julien Nordmann MD IMPRESSIONS  1. Left ventricular ejection fraction, by estimation, is 50 to 55%. The left ventricle has low normal function. The left ventricle has no regional wall motion abnormalities. There is moderate left ventricular hypertrophy. Left ventricular diastolic parameters are consistent with Grade II diastolic dysfunction (pseudonormalization).  2. Right ventricular systolic function is normal. The right ventricular size is normal.  3. A small pericardial effusion is present.1.17 cm off the LV free wall, 0.8 cm off the RV free wall.  4. The mitral valve is normal in structure. Mild mitral valve regurgitation. No evidence of mitral stenosis.  5. The aortic valve is normal in structure. Aortic valve regurgitation is not visualized. No aortic stenosis is present.  6. The inferior vena cava is dilated in size with >50% respiratory variability, suggesting right atrial pressure of 8 mmHg.  7. Left atrial size was mildly dilated. Comparison(s): Prior Echo done at [facility] NORMAL LEFT VENTRICULAR SYSTOLIC FUNCTION WITH MODERATE LVH NORMAL RIGHT VENTRICULAR SYSTOLIC FUNCTION NO VALVULAR STENOSIS MILD MITRAL AND TRICUSPID REGURGITAION DILATED IVC SMALL  prepped and draped in the usual sterile fashion. The overlying soft tissues were anesthetized with 1% lidocaine with epinephrine. A 17 gauge coaxial introducer needle was advanced into the inferior cortex of the left kidney and 2 core biopsies were obtained under direct ultrasound guidance with an 18 gauge core biopsy device. Images were saved for documentation purposes. The biopsy device was removed and hemostasis was obtained with injection of Gel-Foam slurry under ultrasound guidance and manual compression. Post procedural scanning was negative for significant post procedural hemorrhage or additional complication. A dressing was placed. The patient tolerated the procedure well without immediate post procedural complication. IMPRESSION: Technically successful ultrasound guided left renal biopsy. Marliss Coots, MD [facility] Radiology Electronically Signed   By: Marliss Coots M.D.   On: 07/06/2023 13:12   ECHOCARDIOGRAM COMPLETE  Result Date: 07/01/2023    ECHOCARDIOGRAM REPORT   Patient Name:   Shannon Cox Date of Exam: 07/01/2023 Medical Rec #:  [id number]             Height:       65.0 in Accession #:    [id number]            Weight:        254.0 lb Date of Birth:  [birth year]              BSA:          2.190 m Patient Age:    52 years              BP:           189/111 mmHg Patient Gender: F                     HR:           93 bpm. Exam Location:  [facility] Procedure: 2D Echo, Cardiac Doppler and Color Doppler Indications:     CHF I50.31  History:         Patient has prior history of Echocardiogram examinations, most                  recent 03/18/2023. Elevated Troponin; CKD3a; Risk                  Factors:Diabetes, Non-Smoker, Sleep Apnea and Hypertension.  Sonographer:     Dondra Prader RVT RCS Referring Phys:  [id number] Andris Baumann Diagnosing Phys: Julien Nordmann MD IMPRESSIONS  1. Left ventricular ejection fraction, by estimation, is 50 to 55%. The left ventricle has low normal function. The left ventricle has no regional wall motion abnormalities. There is moderate left ventricular hypertrophy. Left ventricular diastolic parameters are consistent with Grade II diastolic dysfunction (pseudonormalization).  2. Right ventricular systolic function is normal. The right ventricular size is normal.  3. A small pericardial effusion is present.1.17 cm off the LV free wall, 0.8 cm off the RV free wall.  4. The mitral valve is normal in structure. Mild mitral valve regurgitation. No evidence of mitral stenosis.  5. The aortic valve is normal in structure. Aortic valve regurgitation is not visualized. No aortic stenosis is present.  6. The inferior vena cava is dilated in size with >50% respiratory variability, suggesting right atrial pressure of 8 mmHg.  7. Left atrial size was mildly dilated. Comparison(s): Prior Echo done at [facility] NORMAL LEFT VENTRICULAR SYSTOLIC FUNCTION WITH MODERATE LVH NORMAL RIGHT VENTRICULAR SYSTOLIC FUNCTION NO VALVULAR STENOSIS MILD MITRAL AND TRICUSPID REGURGITAION DILATED IVC SMALL  prepped and draped in the usual sterile fashion. The overlying soft tissues were anesthetized with 1% lidocaine with epinephrine. A 17 gauge coaxial introducer needle was advanced into the inferior cortex of the left kidney and 2 core biopsies were obtained under direct ultrasound guidance with an 18 gauge core biopsy device. Images were saved for documentation purposes. The biopsy device was removed and hemostasis was obtained with injection of Gel-Foam slurry under ultrasound guidance and manual compression. Post procedural scanning was negative for significant post procedural hemorrhage or additional complication. A dressing was placed. The patient tolerated the procedure well without immediate post procedural complication. IMPRESSION: Technically successful ultrasound guided left renal biopsy. Marliss Coots, MD [facility] Radiology Electronically Signed   By: Marliss Coots M.D.   On: 07/06/2023 13:12   ECHOCARDIOGRAM COMPLETE  Result Date: 07/01/2023    ECHOCARDIOGRAM REPORT   Patient Name:   Shannon Cox Date of Exam: 07/01/2023 Medical Rec #:  [id number]             Height:       65.0 in Accession #:    [id number]            Weight:        254.0 lb Date of Birth:  [birth year]              BSA:          2.190 m Patient Age:    52 years              BP:           189/111 mmHg Patient Gender: F                     HR:           93 bpm. Exam Location:  [facility] Procedure: 2D Echo, Cardiac Doppler and Color Doppler Indications:     CHF I50.31  History:         Patient has prior history of Echocardiogram examinations, most                  recent 03/18/2023. Elevated Troponin; CKD3a; Risk                  Factors:Diabetes, Non-Smoker, Sleep Apnea and Hypertension.  Sonographer:     Dondra Prader RVT RCS Referring Phys:  [id number] Andris Baumann Diagnosing Phys: Julien Nordmann MD IMPRESSIONS  1. Left ventricular ejection fraction, by estimation, is 50 to 55%. The left ventricle has low normal function. The left ventricle has no regional wall motion abnormalities. There is moderate left ventricular hypertrophy. Left ventricular diastolic parameters are consistent with Grade II diastolic dysfunction (pseudonormalization).  2. Right ventricular systolic function is normal. The right ventricular size is normal.  3. A small pericardial effusion is present.1.17 cm off the LV free wall, 0.8 cm off the RV free wall.  4. The mitral valve is normal in structure. Mild mitral valve regurgitation. No evidence of mitral stenosis.  5. The aortic valve is normal in structure. Aortic valve regurgitation is not visualized. No aortic stenosis is present.  6. The inferior vena cava is dilated in size with >50% respiratory variability, suggesting right atrial pressure of 8 mmHg.  7. Left atrial size was mildly dilated. Comparison(s): Prior Echo done at [facility] NORMAL LEFT VENTRICULAR SYSTOLIC FUNCTION WITH MODERATE LVH NORMAL RIGHT VENTRICULAR SYSTOLIC FUNCTION NO VALVULAR STENOSIS MILD MITRAL AND TRICUSPID REGURGITAION DILATED IVC SMALL  prepped and draped in the usual sterile fashion. The overlying soft tissues were anesthetized with 1% lidocaine with epinephrine. A 17 gauge coaxial introducer needle was advanced into the inferior cortex of the left kidney and 2 core biopsies were obtained under direct ultrasound guidance with an 18 gauge core biopsy device. Images were saved for documentation purposes. The biopsy device was removed and hemostasis was obtained with injection of Gel-Foam slurry under ultrasound guidance and manual compression. Post procedural scanning was negative for significant post procedural hemorrhage or additional complication. A dressing was placed. The patient tolerated the procedure well without immediate post procedural complication. IMPRESSION: Technically successful ultrasound guided left renal biopsy. Marliss Coots, MD [facility] Radiology Electronically Signed   By: Marliss Coots M.D.   On: 07/06/2023 13:12   ECHOCARDIOGRAM COMPLETE  Result Date: 07/01/2023    ECHOCARDIOGRAM REPORT   Patient Name:   Shannon Cox Date of Exam: 07/01/2023 Medical Rec #:  [id number]             Height:       65.0 in Accession #:    [id number]            Weight:        254.0 lb Date of Birth:  [birth year]              BSA:          2.190 m Patient Age:    52 years              BP:           189/111 mmHg Patient Gender: F                     HR:           93 bpm. Exam Location:  [facility] Procedure: 2D Echo, Cardiac Doppler and Color Doppler Indications:     CHF I50.31  History:         Patient has prior history of Echocardiogram examinations, most                  recent 03/18/2023. Elevated Troponin; CKD3a; Risk                  Factors:Diabetes, Non-Smoker, Sleep Apnea and Hypertension.  Sonographer:     Dondra Prader RVT RCS Referring Phys:  [id number] Andris Baumann Diagnosing Phys: Julien Nordmann MD IMPRESSIONS  1. Left ventricular ejection fraction, by estimation, is 50 to 55%. The left ventricle has low normal function. The left ventricle has no regional wall motion abnormalities. There is moderate left ventricular hypertrophy. Left ventricular diastolic parameters are consistent with Grade II diastolic dysfunction (pseudonormalization).  2. Right ventricular systolic function is normal. The right ventricular size is normal.  3. A small pericardial effusion is present.1.17 cm off the LV free wall, 0.8 cm off the RV free wall.  4. The mitral valve is normal in structure. Mild mitral valve regurgitation. No evidence of mitral stenosis.  5. The aortic valve is normal in structure. Aortic valve regurgitation is not visualized. No aortic stenosis is present.  6. The inferior vena cava is dilated in size with >50% respiratory variability, suggesting right atrial pressure of 8 mmHg.  7. Left atrial size was mildly dilated. Comparison(s): Prior Echo done at [facility] NORMAL LEFT VENTRICULAR SYSTOLIC FUNCTION WITH MODERATE LVH NORMAL RIGHT VENTRICULAR SYSTOLIC FUNCTION NO VALVULAR STENOSIS MILD MITRAL AND TRICUSPID REGURGITAION DILATED IVC SMALL

## 2023-07-08 NOTE — Progress Notes (Signed)
[facility]  ROUNDING NOTE   Subjective:   Patient seen sitting at bedside, alert and oriented Reports lower extremity edema mildly increased from baseline but has greatly improved since admission. Appropriate appetite  Eager for discharge  Objective:  Vital signs in last 24 hours:  Temp:  [97.7 F (36.5 C)-98.4 F (36.9 C)] 97.9 F (36.6 C) (10/03 1221) Pulse Rate:  [70-85] 70 (10/03 1221) Resp:  [18-20] 18 (10/03 1221) BP: (125-166)/(53-74) 152/73 (10/03 1221) SpO2:  [91 %-100 %] 91 % (10/03 1221)  Weight change: -1.05 kg Filed Weights   07/04/23 0837 07/06/23 1034 07/07/23 0744  Weight: 120.8 kg 120.8 kg 119.7 kg    Intake/Output: I/O last 3 completed shifts: In: 1058.1 [P.O.:1020; I.V.:38.1] Out: 1350 [Urine:1350]   Intake/Output this shift:  Total I/O In: 240 [P.O.:240] Out: 1200 [Urine:1200]  Physical Exam: General: NAD  Head: Normocephalic, atraumatic. Moist oral mucosal membranes  Eyes: Anicteric  Lungs:  Clear to auscultation, normal effort  Heart: Regular rate and rhythm  Abdomen:  Soft, nontender  Extremities:  2+ peripheral edema.  Neurologic: Alert and oriented, moving all four extremities  Skin: No lesions  Access: None    Basic Metabolic Panel: Recent Labs  Lab 07/02/23 0252 07/03/23 0639 07/04/23 0456 07/05/23 0338 07/06/23 0451 07/07/23 0911 07/08/23 0517  NA 138 140 138 138 135 134* 136  K 4.3 4.8 4.7 4.7 4.7 4.7 4.5  CL 106 109 108 107 104 103 104  CO2 24 23 22 22 23 24 24   GLUCOSE 142* 121* 73 99 113* 134* 156*  BUN 40* 48* 54* 60* 65* 67* 65*  CREATININE 3.40* 3.68* 3.83* 3.65* 3.69* 3.90* 3.79*  CALCIUM 8.2* 8.5* 8.4* 8.4* 8.5* 8.3* 8.5*  MG 1.8 2.0 2.1  --   --   --   --   PHOS 4.8* 5.0* 5.1*  --   --   --   --     Liver Function Tests: Recent Labs  Lab 07/05/23 0338  AST 12*  ALT 11  ALKPHOS 68  BILITOT 0.4  PROT 5.6*  ALBUMIN 2.1*   No results for input(s): "LIPASE", "AMYLASE" in the last 168  hours. No results for input(s): "AMMONIA" in the last 168 hours.  CBC: Recent Labs  Lab 07/03/23 0639 07/04/23 0456 07/05/23 0338 07/06/23 0451 07/08/23 0517  WBC 7.3 7.4 7.0 7.6 7.0  NEUTROABS  --   --  4.3  --   --   HGB 9.0* 8.3* 8.3* 8.8* 9.0*  HCT 28.7* 26.3* 26.0* 27.5* 29.1*  MCV 74.7* 74.1* 73.4* 72.8* 75.4*  PLT 314 298 315 322 334    Cardiac Enzymes: No results for input(s): "CKTOTAL", "CKMB", "CKMBINDEX", "TROPONINI" in the last 168 hours.  BNP: Invalid input(s): "POCBNP"  CBG: Recent Labs  Lab 07/07/23 1213 07/07/23 1623 07/07/23 2045 07/08/23 0828 07/08/23 1150  GLUCAP 108* 138* 144* 179* 74    Microbiology: Results for orders placed or performed during the hospital encounter of 06/30/23  Urine Culture (for pregnant, neutropenic or urologic patients or patients with an indwelling urinary catheter)     Status: Abnormal   Collection Time: 07/01/23  4:32 AM   Specimen: Urine, Clean Catch  Result Value Ref Range Status   Specimen Description   Final    URINE, CLEAN CATCH Performed at [facility], [address], [place], [place] [address]    Special Requests   Final    NONE Performed at [facility], [address] [place], [place] [address]    Culture >=100,000 COLONIES/mL ESCHERICHIA COLI (A)  Final   Report Status 07/03/2023 FINAL  Final   Organism ID, Bacteria ESCHERICHIA COLI (A)  Final      Susceptibility   Escherichia coli - MIC*    AMPICILLIN >=32 RESISTANT Resistant     CEFAZOLIN <=4 SENSITIVE Sensitive     CEFEPIME <=0.12 SENSITIVE Sensitive     CEFTRIAXONE <=0.25 SENSITIVE Sensitive     CIPROFLOXACIN <=0.25 SENSITIVE Sensitive     GENTAMICIN <=1 SENSITIVE Sensitive     IMIPENEM <=0.25 SENSITIVE Sensitive     NITROFURANTOIN <=16 SENSITIVE Sensitive     TRIMETH/SULFA <=20 SENSITIVE Sensitive     AMPICILLIN/SULBACTAM 4 SENSITIVE Sensitive     PIP/TAZO <=4 SENSITIVE Sensitive     * >=100,000 COLONIES/mL  ESCHERICHIA COLI    Coagulation Studies: No results for input(s): "LABPROT", "INR" in the last 72 hours.   Urinalysis: No results for input(s): "COLORURINE", "LABSPEC", "PHURINE", "GLUCOSEU", "HGBUR", "BILIRUBINUR", "KETONESUR", "PROTEINUR", "UROBILINOGEN", "NITRITE", "LEUKOCYTESUR" in the last 72 hours.  Invalid input(s): "APPERANCEUR"     Imaging: No results found.   Medications:    sodium chloride      vitamin B-12  1,000 mcg Oral Daily   enoxaparin (LOVENOX) injection  30 mg Subcutaneous Q24H   feeding supplement (GLUCERNA SHAKE)  237 mL Oral TID BM   folic acid  1 mg Oral Daily   hydrALAZINE  50 mg Oral Q8H   influenza vac split trivalent PF  0.5 mL Intramuscular Tomorrow-1000   insulin aspart  0-20 Units Subcutaneous TID WC   insulin aspart  0-5 Units Subcutaneous QHS   insulin glargine-yfgn  20 Units Subcutaneous QHS   isosorbide mononitrate  60 mg Oral Daily   metoprolol tartrate  75 mg Oral BID   pantoprazole  20 mg Oral Daily   pneumococcal 20-valent conjugate vaccine  0.5 mL Intramuscular Tomorrow-1000   rosuvastatin  20 mg Oral Daily   torsemide  40 mg Oral BID   Vitamin D (Ergocalciferol)  50,000 Units Oral Q7 days   acetaminophen **OR** acetaminophen, alum & mag hydroxide-simeth, hydrALAZINE, ondansetron **OR** ondansetron (ZOFRAN) IV, mouth rinse  Assessment/ Plan:  Shannon Cox is a 52 y.o.  female with medical problems of hypertension, insulin-dependent diabetes with neuropathy and retinopathy, foot drop, chronic kidney disease, anemia, morbid obesity, recurrent pancreatitis, was admitted on 06/30/2023 for Shortness of breath [R06.02] Acute CHF (HCC) [I50.9] AKI (acute kidney injury) (HCC) [N17.9]   Acute kidney injury on chronic kidney disease stage IIIb. Underlying CKD risk factors include poorly controlled diabetes, hypertension, obesity, use of nonsteroidals. Baseline creatinine of 1.85, GFR 33 from 04/14/2023.  Urine protein to  creatinine ratio of 13 g. Empagliflozin, valsartan on hold.  Creatinine remained stable.  Furosemide drip stopped yesterday.  Patient now on torsemide 40 mg twice daily.  Also received metolazone 2.5 mg today.  Will continue this twice a week.  Patient encouraged to follow with primary nephrologist at discharge.  Lab Results  Component Value Date   CREATININE 3.79 (H) 07/08/2023   CREATININE 3.90 (H) 07/07/2023   CREATININE 3.69 (H) 07/06/2023    Intake/Output Summary (Last 24 hours) at 07/08/2023 1516 Last data filed at 07/08/2023 1052 Gross per 24 hour  Intake 240 ml  Output 1850 ml  Net -1610 ml   2. Diabetes type 2 with CKD Most recent hemoglobin A1c 7.1%. Current regimen includes insulin Semglee  Glucose well-managed.    3. Nephrotic proteinuria  Urine protein to creatinine ratio of 13 g as outpatient. Serologies negative.  Most likely it is related to diabetes.  Appreciate IR completing renal biopsy on 07/06/23. Sample sent to [facility].  Patient will follow-up with [facility] nephrology for biopsy results.   4. Hypertension with lower extremity edema  Continue hydralazine and metoprolol for now. Blood pressure 152/73.  Continue torsemide 40mg  BID and Metolazone 2.5mg  twice a week at discharge.   Patient is cleared to discharge from renal stance to follow-up with primary nephrologist for diuretic management and biopsy results.   LOS: 7 Tobie Perdue 10/3/20243:16 PM

## 2023-07-12 NOTE — Progress Notes (Unsigned)
Advanced Heart Failure Clinic Note   Referring Physician: recent admit PCP: Pcp, No Cardiologist: Fransico Michael, [place], PA (to be seen 10/24)  HPI:  Shannon Cox is a 52 y/o female with a history of  Admitted 03/17/23 due to nausea a week ago, and had one episode of diarrhea 3 days ago, then started to have epigastric pain since that time. Her pain was stabbing in nature, located on epigastric and LUQ area, radiating to her mid back, associated with nausea, diarrhea, not with vomiting, moderately relieved with home oral opoid pain medications, no aggravating factors, constant. Reported some chills, but denied fever, LOC, fall, headache, dizziness, chest pain or pressure, SOB, cough, sputum production, hemoptysis, hematemesis, hematochezia, melena, dysuria, hematuria, or focal weakness. BP elevated. Urine + UTI. EKG was not remarkable for acute ischemia, CXR showed no active lesions except cardiomegaly, CT AP showed small pericardial effusion, small retroperitoneal fluids, normal pancreas, diverticulosis without inflammation. IVF given. GI symptoms improved on own. Empiric trial of PPI for now given patient-reported delay in arranging endoscopy. Significant hypoalbuminemia of 1.9 and 3+ protein on UA. TTE this admission with normal EF. Small pericardial effusion without hemodynamic significance.     Admitted 06/30/23 due to shortness of breath that acutely worsened on the evening of presentation while lying flat, improved somewhat by sitting upright. She had an upper endoscopy the day prior at [facility] that showed gastritis, biopsies pending. She reports that her lower extremity edema has been worsening for the past week. Troponin 27 and BNP 250.  EKG showed NSR at 95 with no acute ST-T wave changes. Chest x-ray shows cardiomegaly with no active cardiopulmonary disease. IV diuresed. Urinalysis positive for UTI, urine protein> 3000, nephrotic range. Nephrology consulted, recommended renal biopsy, IR consulted, s/p  left renal biopsy done 10/1. Antibiotics given for UTI.   Echo 03/18/23: EF >55% with moderate LVH, mild LAE, mild MR/ TR Echo 07/01/23: EF 50-55% with moderate LVH, Grade II DD, small pericardial effusion, mild MR  She presents today for her initial visit with a chief complaint of    Review of Systems: [y] = yes, [ ]  = no   General: Weight gain [ ] ; Weight loss [ ] ; Anorexia [ ] ; Fatigue [ ] ; Fever [ ] ; Chills [ ] ; Weakness [ ]   Cardiac: Chest pain/pressure [ ] ; Resting SOB [ ] ; Exertional SOB [ ] ; Orthopnea [ ] ; Pedal Edema [ ] ; Palpitations [ ] ; Syncope [ ] ; Presyncope [ ] ; Paroxysmal nocturnal dyspnea[ ]   Pulmonary: Cough [ ] ; Wheezing[ ] ; Hemoptysis[ ] ; Sputum [ ] ; Snoring [ ]   GI: Vomiting[ ] ; Dysphagia[ ] ; Melena[ ] ; Hematochezia [ ] ; Heartburn[ ] ; Abdominal pain [ ] ; Constipation [ ] ; Diarrhea [ ] ; BRBPR [ ]   GU: Hematuria[ ] ; Dysuria [ ] ; Nocturia[ ]   Vascular: Pain in legs with walking [ ] ; Pain in feet with lying flat [ ] ; Non-healing sores [ ] ; Stroke [ ] ; TIA [ ] ; Slurred speech [ ] ;  Neuro: Headaches[ ] ; Vertigo[ ] ; Seizures[ ] ; Paresthesias[ ] ;Blurred vision [ ] ; Diplopia [ ] ; Vision changes [ ]   Ortho/Skin: Arthritis [ ] ; Joint pain [ ] ; Muscle pain [ ] ; Joint swelling [ ] ; Back Pain [ ] ; Rash [ ]   Psych: Depression[ ] ; Anxiety[ ]   Heme: Bleeding problems [ ] ; Clotting disorders [ ] ; Anemia [ ]   Endocrine: Diabetes [ ] ; Thyroid dysfunction[ ]    Past Medical History:  Diagnosis Date   Hypertension     Current Outpatient Medications  Medication Sig Dispense Refill  cetirizine (ZYRTEC) 10 MG tablet Take 10 mg by mouth daily.     Cholecalciferol 1.25 MG (50000 UT) capsule Take 50,000 Units by mouth once a week.     cyanocobalamin 1000 MCG tablet Take 1 tablet (1,000 mcg total) by mouth daily. 30 tablet 2   diclofenac Sodium (VOLTAREN) 1 % GEL Apply 2 g topically 4 (four) times daily.     fluticasone (FLONASE) 50 MCG/ACT nasal spray Place 2 sprays into both nostrils daily.  11.1 mL 0   folic acid (FOLVITE) 1 MG tablet Take 1 tablet (1 mg total) by mouth daily. 30 tablet 2   glipiZIDE (GLUCOTROL XL) 5 MG 24 hr tablet Take 1 tablet by mouth 2 (two) times daily.     hydrALAZINE (APRESOLINE) 25 MG tablet Take 1 tablet by mouth 2 (two) times daily with a meal.     isosorbide mononitrate (IMDUR) 60 MG 24 hr tablet Take 1 tablet (60 mg total) by mouth daily. 30 tablet 2   lidocaine (XYLOCAINE) 2 % solution Use as directed 15 mLs in the mouth or throat as needed for mouth pain. (Patient not taking: Reported on 07/01/2023) 100 mL 0   meclizine (ANTIVERT) 25 MG tablet Take 1 tablet (25 mg total) by mouth 3 (three) times daily as needed for dizziness. 30 tablet 0   metolazone (ZAROXOLYN) 2.5 MG tablet Take 1 tablet (2.5 mg total) by mouth 2 (two) times a week. 8 tablet 2   Metoprolol Tartrate 75 MG TABS Take 1 tablet (75 mg total) by mouth 2 (two) times daily. 30 tablet 2   ondansetron (ZOFRAN-ODT) 4 MG disintegrating tablet Allow 1-2 tablets to dissolve in your mouth every 8 hours as needed for nausea/vomiting 30 tablet 0   pantoprazole (PROTONIX) 20 MG tablet Take 1 tablet (20 mg total) by mouth daily. 30 tablet 1   potassium chloride SA (KLOR-CON M) 20 MEQ tablet Take 1 tablet (20 mEq total) by mouth daily for 5 days. 5 tablet 0   rosuvastatin (CRESTOR) 20 MG tablet Take 1 tablet (20 mg total) by mouth daily. 30 tablet 2   torsemide 40 MG TABS Take 40 mg by mouth 2 (two) times daily. 60 tablet 2   TRESIBA FLEXTOUCH 200 UNIT/ML FlexTouch Pen Inject 25 Units into the skin daily.     No current facility-administered medications for this visit.    Allergies  Allergen Reactions   Insulins     Levimir, skin reaction.    Sulfa Antibiotics Rash      Social History   Socioeconomic History   Marital status: Divorced    Spouse name: Not on file   Number of children: Not on file   Years of education: Not on file   Highest education level: Not on file  Occupational History    Not on file  Tobacco Use   Smoking status: Never   Smokeless tobacco: Never  Substance and Sexual Activity   Alcohol use: Not Currently   Drug use: Never   Sexual activity: Not on file  Other Topics Concern   Not on file  Social History Narrative   Not on file   Social Determinants of Health   Financial Resource Strain: Low Risk  (03/18/2021)   Received from [facility], [facility]   Overall Financial Resource Strain (CARDIA)    Difficulty of Paying Living Expenses: Not very hard  Food Insecurity: No Food Insecurity (07/07/2023)   Hunger Vital Sign    Worried  About Running Out of Food in the Last Year: Never true    Ran Out of Food in the Last Year: Never true  Transportation Needs: No Transportation Needs (07/07/2023)   PRAPARE - Administrator, Civil Service (Medical): No    Lack of Transportation (Non-Medical): No  Physical Activity: Inactive (03/18/2021)   Received from [facility], [facility]   Exercise Vital Sign    Days of Exercise per Week: 0 days    Minutes of Exercise per Session: 0 min  Stress: Stress Concern Present (03/18/2021)   Received from [facility], [facility]   [employer] of Occupational Health - Occupational Stress Questionnaire    Feeling of Stress : Rather much  Social Connections: Unknown (03/18/2021)   Received from [facility], [facility]   Social Connection and Isolation Panel [NHANES]    Frequency of Communication with Friends and Family: More than three times a week    Frequency of Social Gatherings with Friends and Family: More than three times a week    Attends Religious Services: Never    Database administrator or Organizations: No    Attends Banker Meetings: Never    Marital Status: Patient declined  Catering manager Violence: Not At Risk (07/07/2023)    Humiliation, Afraid, Rape, and Kick questionnaire    Fear of Current or Ex-Partner: No    Emotionally Abused: No    Physically Abused: No    Sexually Abused: No     No family history on file.   PHYSICAL EXAM: General:  Well appearing. No respiratory difficulty HEENT: normal Neck: supple. no JVD. No lymphadenopathy or thyromegaly appreciated. Cor: PMI nondisplaced. Regular rate & rhythm. No rubs, gallops or murmurs. Lungs: clear Abdomen: soft, nontender, nondistended. No hepatosplenomegaly. No bruits or masses.  Extremities: no cyanosis, clubbing, rash, edema Neuro: alert & oriented x 3, cranial nerves grossly intact. moves all 4 extremities w/o difficulty. Affect pleasant.  ECG:   ASSESSMENT & PLAN:  1: Chronic heart failure with preserved ejection fraction- - suspect due to - NYHA class - euvolemic - weighing daily - Echo 03/18/23: EF >55% with moderate LVH, mild LAE, mild MR/ TR - Echo 07/01/23: EF 50-55% with moderate LVH, Grade II DD, small pericardial effusion, mild MR - continue  - BNP  2: HTN- - BP - no PCP - BMP  3: DM- - A1c - saw endocrinology  4: CKD- - saw nephrology   Delma Freeze, FNP 07/12/23

## 2023-07-13 ENCOUNTER — Other Ambulatory Visit (HOSPITAL_COMMUNITY): Payer: Self-pay

## 2023-07-13 ENCOUNTER — Encounter: Payer: Self-pay | Admitting: Family

## 2023-07-13 ENCOUNTER — Ambulatory Visit: Payer: BLUE CROSS/BLUE SHIELD | Attending: Family | Admitting: Family

## 2023-07-13 VITALS — BP 184/80 | HR 85 | Wt 256.2 lb

## 2023-07-13 DIAGNOSIS — I10 Essential (primary) hypertension: Secondary | ICD-10-CM

## 2023-07-13 DIAGNOSIS — E669 Obesity, unspecified: Secondary | ICD-10-CM | POA: Insufficient documentation

## 2023-07-13 DIAGNOSIS — G4733 Obstructive sleep apnea (adult) (pediatric): Secondary | ICD-10-CM | POA: Insufficient documentation

## 2023-07-13 DIAGNOSIS — I5032 Chronic diastolic (congestive) heart failure: Secondary | ICD-10-CM | POA: Diagnosis not present

## 2023-07-13 DIAGNOSIS — N189 Chronic kidney disease, unspecified: Secondary | ICD-10-CM | POA: Diagnosis not present

## 2023-07-13 DIAGNOSIS — I130 Hypertensive heart and chronic kidney disease with heart failure and stage 1 through stage 4 chronic kidney disease, or unspecified chronic kidney disease: Secondary | ICD-10-CM | POA: Insufficient documentation

## 2023-07-13 DIAGNOSIS — Z8719 Personal history of other diseases of the digestive system: Secondary | ICD-10-CM | POA: Diagnosis not present

## 2023-07-13 DIAGNOSIS — Z794 Long term (current) use of insulin: Secondary | ICD-10-CM | POA: Diagnosis not present

## 2023-07-13 DIAGNOSIS — I428 Other cardiomyopathies: Secondary | ICD-10-CM | POA: Diagnosis not present

## 2023-07-13 DIAGNOSIS — N185 Chronic kidney disease, stage 5: Secondary | ICD-10-CM | POA: Diagnosis not present

## 2023-07-13 DIAGNOSIS — I509 Heart failure, unspecified: Secondary | ICD-10-CM | POA: Diagnosis not present

## 2023-07-13 DIAGNOSIS — R0683 Snoring: Secondary | ICD-10-CM

## 2023-07-13 DIAGNOSIS — I3139 Other pericardial effusion (noninflammatory): Secondary | ICD-10-CM | POA: Diagnosis not present

## 2023-07-13 DIAGNOSIS — Z79899 Other long term (current) drug therapy: Secondary | ICD-10-CM | POA: Insufficient documentation

## 2023-07-13 DIAGNOSIS — K861 Other chronic pancreatitis: Secondary | ICD-10-CM | POA: Diagnosis not present

## 2023-07-13 DIAGNOSIS — E119 Type 2 diabetes mellitus without complications: Secondary | ICD-10-CM | POA: Diagnosis not present

## 2023-07-13 DIAGNOSIS — E1122 Type 2 diabetes mellitus with diabetic chronic kidney disease: Secondary | ICD-10-CM | POA: Insufficient documentation

## 2023-07-13 IMAGING — CR DG LUMBAR SPINE COMPLETE 4+V
1 series · 6 of 6 positions shown · non-contrast
Comparison: X-ray 04/01/2021.

CLINICAL DATA: Lumbago.  History of degenerative disc disease.

EXAM:
LUMBAR SPINE - COMPLETE 4+ VIEW

[Series 1: dg lumbar spine complete 4 +v · 0.14mm/px · 6 of 6 slices shown]
[im 1/6]
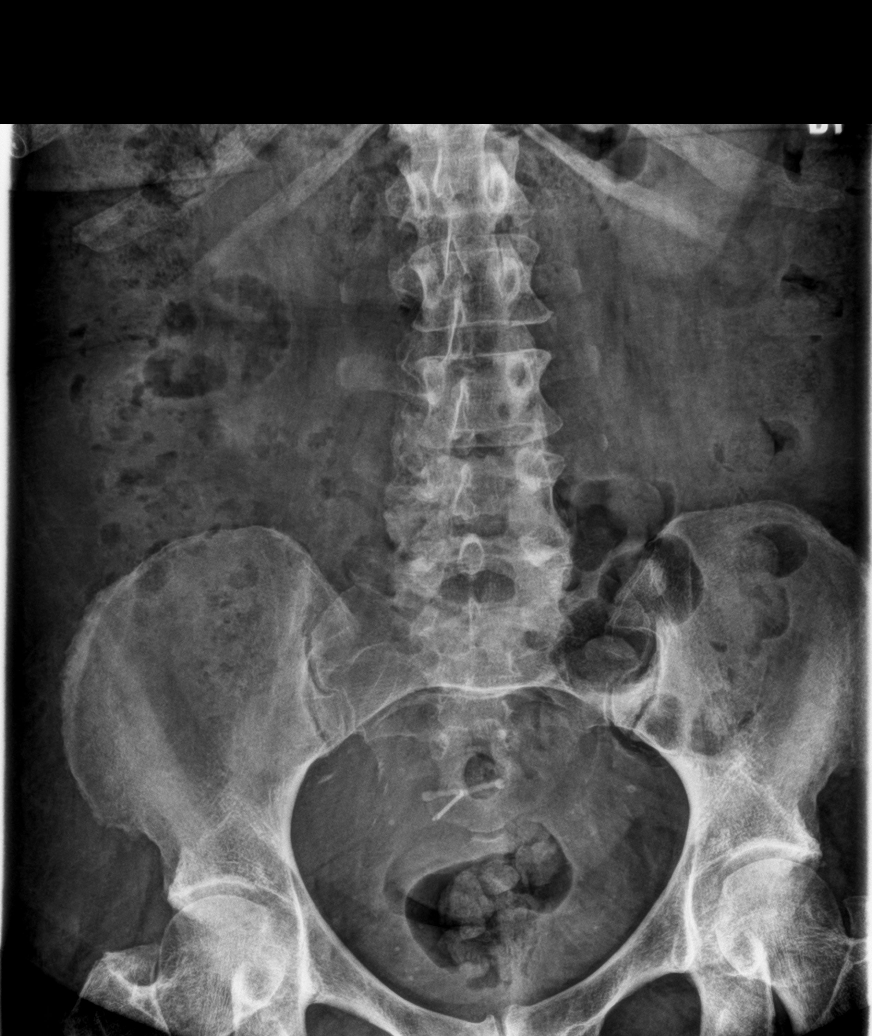
[im 2/6]
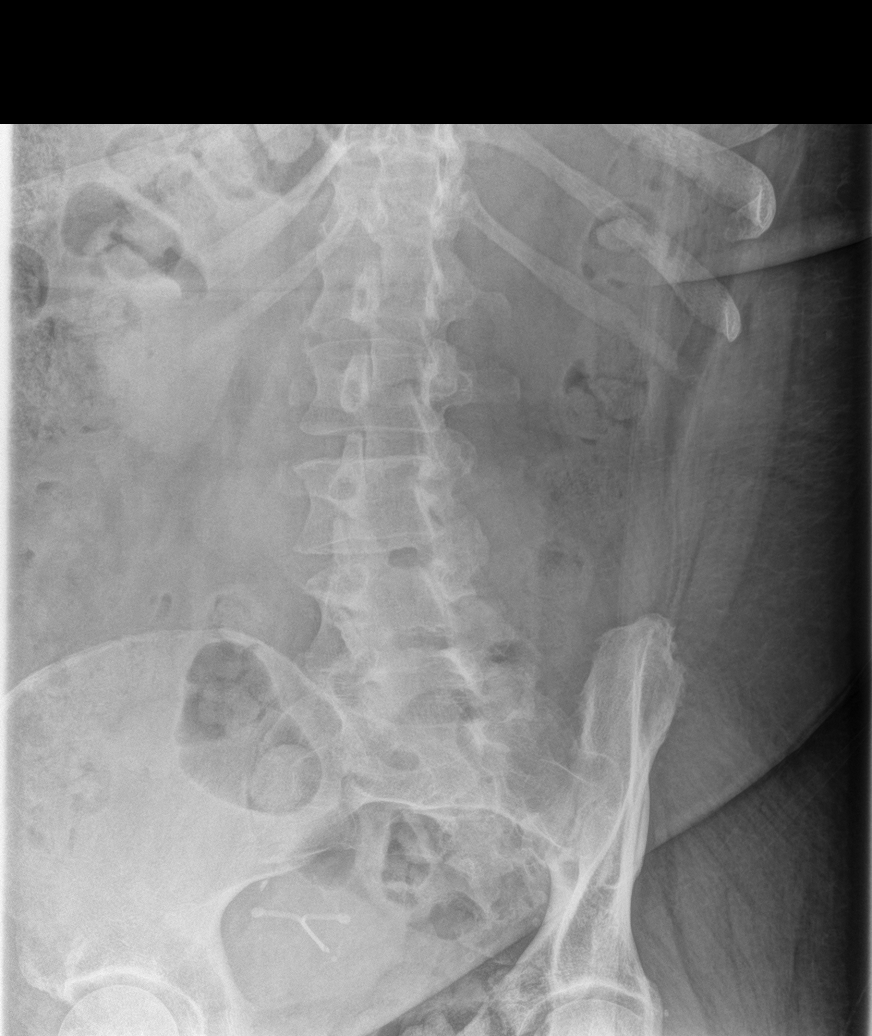
[im 3/6]
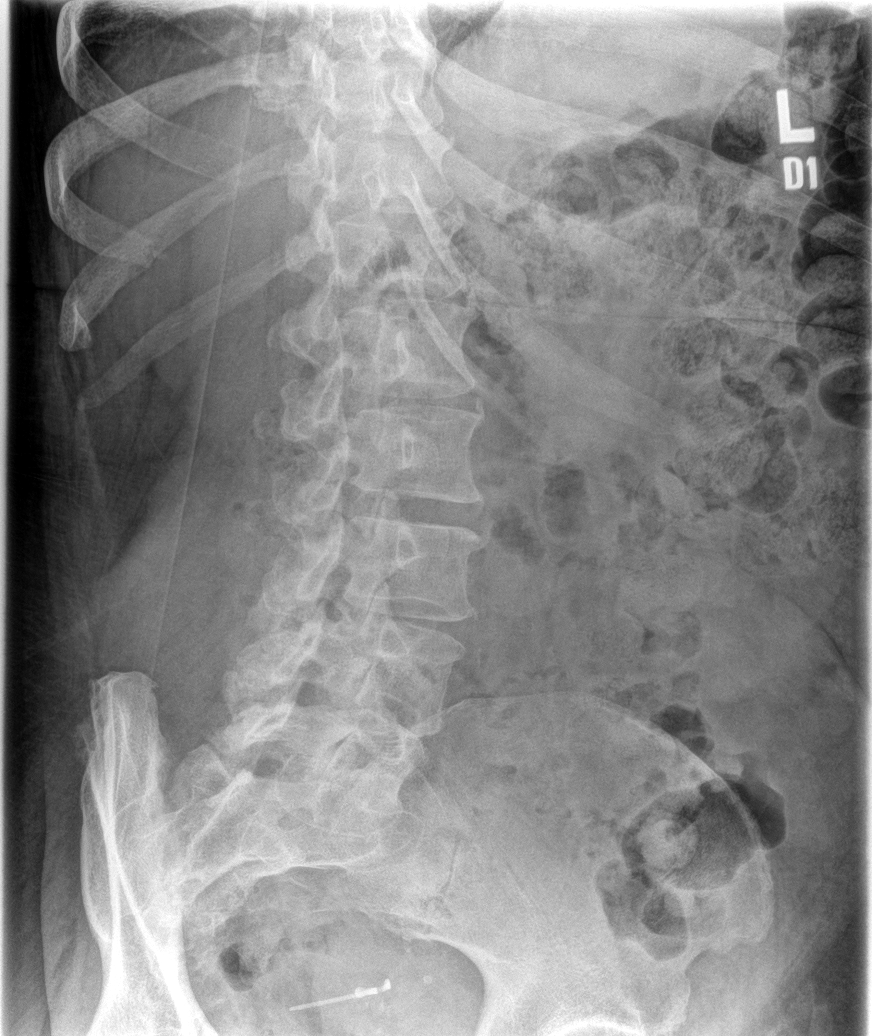
[im 4/6]
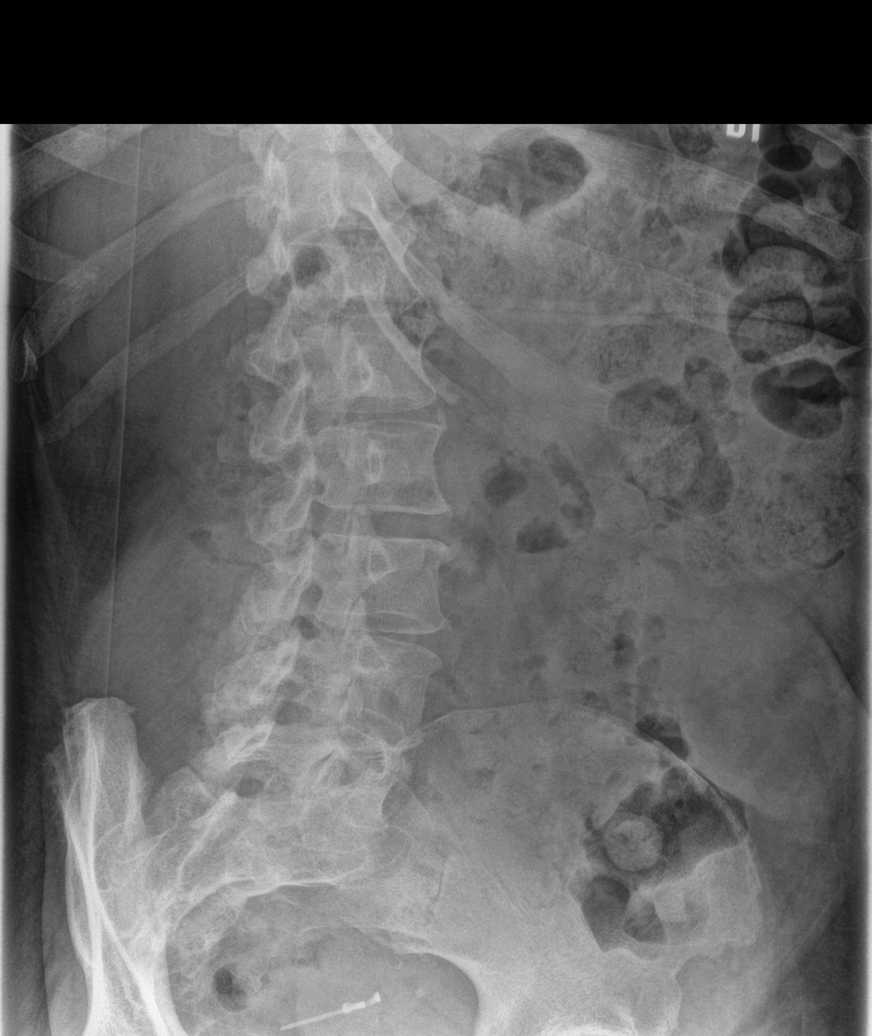
[im 5/6]
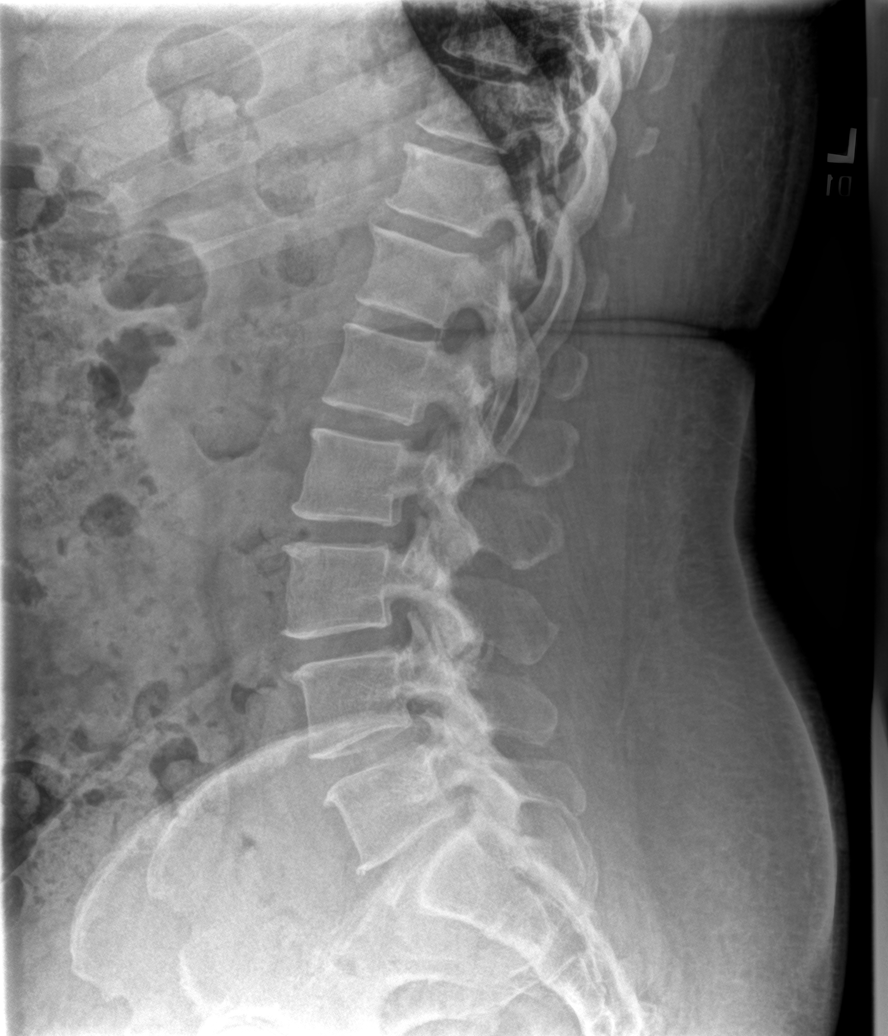
[im 6/6]
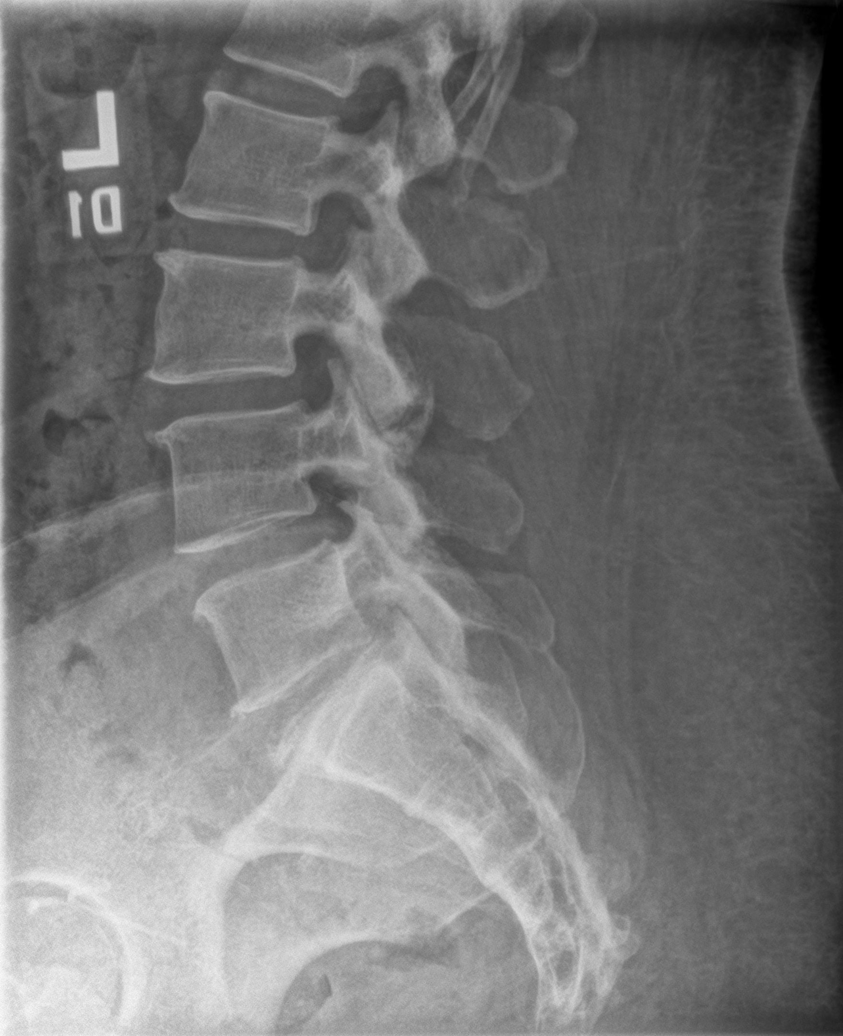

[6 of 6 positions shown; findings below may reference images not displayed]

FINDINGS: Frontal, bilateral oblique, lateral views of the lumbar spine are
obtained. 5 non-rib-bearing lumbar type vertebral bodies are in
stable alignment. No acute fracture. Multilevel facet hypertrophy is
again noted and unchanged, greatest at L4-5 and L5-S1. Mild lower
lumbar spondylosis again noted and unchanged, greatest from L3-4
through L5-S1. Bilateral sacroiliac joints are unremarkable. IUD is
seen within the pelvis.
IMPRESSION: 1. Stable lower lumbar spondylosis and facet hypertrophy.
2. No acute bony abnormality.

## 2023-07-13 MED ORDER — POTASSIUM CHLORIDE CRYS ER 20 MEQ PO TBCR: 20.0000 meq | EXTENDED_RELEASE_TABLET | Freq: Every day | ORAL | 3 refills | Status: AC

## 2023-07-13 MED ORDER — FUROSEMIDE 20 MG PO TABS
60.0000 mg | ORAL_TABLET | Freq: Every day | ORAL | 3 refills | Status: AC
Start: 2023-07-13 — End: 2023-10-11

## 2023-07-13 NOTE — Patient Instructions (Addendum)
  Take Lasix 60 mg (3 tablets) daily.  Take Potassium 20 mEq (1 tablet) daily.    Follow up in 1 Week with Clarisa Kindred, FNP.     Call primary care office to get an appointment.    Put compression socks on every morning with removal at bedtime.    Continue weighing daily and call for an overnight weight gain of 3 pounds or more or a weekly weight gain of more than 5 pounds.   Bring medication bottles to every visit

## 2023-07-13 NOTE — Progress Notes (Signed)
   07/13/23 1619  ReDS Vest / Clip  Station Marker D  Ruler Value 38  ReDS Value Range (!) > 40  ReDS Actual Value 58

## 2023-07-21 NOTE — Progress Notes (Unsigned)
Advanced Heart Failure Clinic Note   Referring Physician: recent admit PCP: Pcp, No Cardiologist: Fransico Michael, Cadence, PA (to be seen 10/24)  HPI:  [name] is a 52 y/o female with a history of anemia, CKD, IDDM, obesity, OSA, HTN, recurrent pancreatitis, gastritis and chronic heart failure.   Admitted 03/17/23 due to nausea a week ago, and had one episode of diarrhea 3 days ago, then started to have epigastric pain since that time. Her pain was stabbing in nature, located on epigastric and LUQ area, radiating to her mid back, associated with nausea, diarrhea, not with vomiting, moderately relieved with home oral opoid pain medications, no aggravating factors, constant. Reported some chills, but denied fever, LOC, fall, headache, dizziness, chest pain or pressure, SOB, cough, sputum production, hemoptysis, hematemesis, hematochezia, melena, dysuria, hematuria, or focal weakness. BP elevated. Urine + UTI. EKG was not remarkable for acute ischemia, CXR showed no active lesions except cardiomegaly, CT AP showed small pericardial effusion, small retroperitoneal fluids, normal pancreas, diverticulosis without inflammation. IVF given. GI symptoms improved on own. Empiric trial of PPI for now given patient-reported delay in arranging endoscopy. Significant hypoalbuminemia of 1.9 and 3+ protein on UA. TTE this admission with normal EF. Small pericardial effusion without hemodynamic significance.     Admitted 06/30/23 due to shortness of breath that acutely worsened on the evening of presentation while lying flat, improved somewhat by sitting upright. She had an upper endoscopy the day prior at [facility] that showed gastritis, biopsies pending. She reports that her lower extremity edema has been worsening for the past week. Troponin 27 and BNP 250.  EKG showed NSR at 95 with no acute ST-T wave changes. Chest x-ray shows cardiomegaly with no active cardiopulmonary disease. IV diuresed. Urinalysis positive for UTI, urine  protein> 3000, nephrotic range. Nephrology consulted, recommended renal biopsy, IR consulted, s/p left renal biopsy done 10/1. Antibiotics given for UTI.   Echo 03/18/23: EF >55% with moderate LVH, mild LAE, mild MR/ TR Echo 07/01/23: EF 50-55% with moderate LVH, Grade II DD, small pericardial effusion, mild MR  She presents today for her initial visit with a chief complaint of moderate SOB with minimal exertion. Chronic in nature. Has associated wheezing, fatigue, dry cough, dizziness, pedal edema (improving) and chronic difficulty sleeping along with this. Denies chest pain, palpitations or abdominal distention. Does endorse snoring and says that she had a "self sleep study" done but then her PCP office closed and she doesn't know if she will get those results or not.   Has scales but is currently not weighing herself. Has compression socks but doesn't wear them daily.   Has not taken her medications yet today due to nausea related to her gastritis. She says that this occurs a couple of days / week. She is unclear of medications and didn't bring them. Torsemide is listed but when her pharmacy was called, we were told that she's taking 20mg  furosemide instead. Has potassium that she takes "every now and then".     ROS: All systems negative except as listed in HPI, PMH and Problem List.  SH:  Social History   Socioeconomic History   Marital status: Divorced    Spouse name: Not on file   Number of children: Not on file   Years of education: Not on file   Highest education level: Not on file  Occupational History   Not on file  Tobacco Use   Smoking status: Never   Smokeless tobacco: Never  Substance and Sexual  Activity   Alcohol use: Not Currently   Drug use: Never   Sexual activity: Not on file  Other Topics Concern   Not on file  Social History Narrative   Not on file   Social Determinants of Health   Financial Resource Strain: Low Risk  (03/18/2021)   Received from [facility], [facility]   Overall Financial Resource Strain (CARDIA)    Difficulty of Paying Living Expenses: Not very hard  Food Insecurity: No Food Insecurity (07/07/2023)   Hunger Vital Sign    Worried About Running Out of Food in the Last Year: Never true    Ran Out of Food in the Last Year: Never true  Transportation Needs: No Transportation Needs (07/07/2023)   PRAPARE - Administrator, Civil Service (Medical): No    Lack of Transportation (Non-Medical): No  Physical Activity: Inactive (03/18/2021)   Received from [facility], [facility]   Exercise Vital Sign    Days of Exercise per Week: 0 days    Minutes of Exercise per Session: 0 min  Stress: Stress Concern Present (03/18/2021)   Received from [facility], [facility]   [employer] of Occupational Health - Occupational Stress Questionnaire    Feeling of Stress : Rather much  Social Connections: Unknown (03/18/2021)   Received from [facility], [facility]   Social Connection and Isolation Panel [NHANES]    Frequency of Communication with Friends and Family: More than three times a week    Frequency of Social Gatherings with Friends and Family: More than three times a week    Attends Religious Services: Never    Database administrator or Organizations: No    Attends Banker Meetings: Never    Marital Status: Patient declined  Catering manager Violence: Not At Risk (07/07/2023)   Humiliation, Afraid, Rape, and Kick questionnaire    Fear of Current or Ex-Partner: No    Emotionally Abused: No    Physically Abused: No    Sexually Abused: No    FH: No family history on file.  Past Medical History:  Diagnosis Date   Hypertension     Current Outpatient Medications  Medication Sig Dispense Refill   cetirizine (ZYRTEC) 10 MG tablet Take 10 mg by mouth daily.      Cholecalciferol 1.25 MG (50000 UT) capsule Take 50,000 Units by mouth once a week.     cyanocobalamin 1000 MCG tablet Take 1 tablet (1,000 mcg total) by mouth daily. 30 tablet 2   diclofenac Sodium (VOLTAREN) 1 % GEL Apply 2 g topically 4 (four) times daily.     fluticasone (FLONASE) 50 MCG/ACT nasal spray Place 2 sprays into both nostrils daily. 11.1 mL 0   folic acid (FOLVITE) 1 MG tablet Take 1 tablet (1 mg total) by mouth daily. 30 tablet 2   furosemide (LASIX) 20 MG tablet Take 3 tablets (60 mg total) by mouth daily. 270 tablet 3   glipiZIDE (GLUCOTROL XL) 5 MG 24 hr tablet Take 1 tablet by mouth 2 (two) times daily.     hydrALAZINE (APRESOLINE) 25 MG tablet Take 1 tablet by mouth 2 (two) times daily with a meal.     isosorbide mononitrate (IMDUR) 60 MG 24 hr tablet Take 1 tablet (60 mg total) by mouth daily. 30 tablet 2   lidocaine (XYLOCAINE) 2 % solution Use as directed 15 mLs in  the mouth or throat as needed for mouth pain. 100 mL 0   meclizine (ANTIVERT) 25 MG tablet Take 1 tablet (25 mg total) by mouth 3 (three) times daily as needed for dizziness. 30 tablet 0   metolazone (ZAROXOLYN) 2.5 MG tablet Take 1 tablet (2.5 mg total) by mouth 2 (two) times a week. 8 tablet 2   Metoprolol Tartrate 75 MG TABS Take 1 tablet (75 mg total) by mouth 2 (two) times daily. 30 tablet 2   ondansetron (ZOFRAN-ODT) 4 MG disintegrating tablet Allow 1-2 tablets to dissolve in your mouth every 8 hours as needed for nausea/vomiting 30 tablet 0   pantoprazole (PROTONIX) 20 MG tablet Take 1 tablet (20 mg total) by mouth daily. 30 tablet 1   potassium chloride SA (KLOR-CON M) 20 MEQ tablet Take 1 tablet (20 mEq total) by mouth daily. 90 tablet 3   rosuvastatin (CRESTOR) 20 MG tablet Take 1 tablet (20 mg total) by mouth daily. 30 tablet 2   TRESIBA FLEXTOUCH 200 UNIT/ML FlexTouch Pen Inject 25 Units into the skin daily.     No current facility-administered medications for this visit.    There were no vitals  filed for this visit.  PHYSICAL EXAM:  General:  Well appearing. No resp difficulty HEENT: normal Neck: supple. JVP flat. Carotids 2+ bilaterally; no bruits. No lymphadenopathy or thryomegaly appreciated. Cor: PMI normal. Regular rate & rhythm. No rubs, gallops or murmurs. Lungs: clear Abdomen: soft, nontender, nondistended. No hepatosplenomegaly. No bruits or masses. Good bowel sounds. Extremities: no cyanosis, clubbing, rash, edema Neuro: alert & orientedx3, cranial nerves grossly intact. Moves all 4 extremities w/o difficulty. Affect pleasant.   ECG:   ASSESSMENT & PLAN:  1: NICM with preserved ejection fraction- - suspect due to uncontrolled HTN - NYHA class III - moderately fluid overloaded with symptoms and elevated ReDs reading - has scales; instructed to being weighing daily and call for overnight weight gain of > 2 pounds or a weekly weight gain of > 5 pounds - ReDs 58% - Echo 03/18/23: EF >55% with moderate LVH, mild LAE, mild MR/ TR - Echo 07/01/23: EF 50-55% with moderate LVH, Grade II DD, small pericardial effusion, mild MR - continue metolazone 2.5mg  2x/ weekly - continue metoprolol tartrate 75mg  BID - increase furosemide to 60mg  daily - begin potassium daily - BMP next week - due to renal function, unable to use SGLT2, MRA, ARB/ ARNI - start wearing compression socks daily with removal at bedtime - BNP 06/30/23 was 250.6  2: HTN- - BP 184/80 but she hasn't taken any of her medications yet today (~3:30pm) due to nausea related to gastritis - continue hydralazine 25mg  BID - continue isosorbide MN 60mg  daily - no PCP as previous one's office closed the end of August; has a list and emphasized that she call and get something scheduled - BMP 07/08/23 showed sodium 136, potassium 4.5, creatinine 3.79 & GFR 14  3: DM- - A1c 07/01/23 was 6.9% - saw endocrinology (Coviello) 05/24 - continue glipizide 5mg  BID  4: CKD- - saw nephrology Eulah Pont) 06/24; returns  11/24 - BMP 07/08/23 creatinine 3.79 & GFR 14  5: Snoring- - if she doesn't get her sleep study results will see if we can order [place] home sleep study to see about OSA  Emphasized bring her medication bottles to every visit. Also instructed her to take today's list and compare it to the bottles that she has at home and to let us know of any discrepancies.  Return in 1 week, sooner if needed.   Delma Freeze, FNP 07/12/23

## 2023-07-22 ENCOUNTER — Other Ambulatory Visit
Admission: RE | Admit: 2023-07-22 | Discharge: 2023-07-22 | Disposition: A | Discharge status: Home / Self Care | Payer: BLUE CROSS/BLUE SHIELD | Source: Ambulatory Visit | Attending: Family | Admitting: Family

## 2023-07-22 ENCOUNTER — Encounter: Payer: Self-pay | Admitting: Family

## 2023-07-22 ENCOUNTER — Ambulatory Visit: Payer: BLUE CROSS/BLUE SHIELD | Admitting: Family

## 2023-07-22 VITALS — BP 138/67 | HR 74 | Wt 248.0 lb

## 2023-07-22 DIAGNOSIS — N189 Chronic kidney disease, unspecified: Secondary | ICD-10-CM | POA: Diagnosis not present

## 2023-07-22 DIAGNOSIS — I3139 Other pericardial effusion (noninflammatory): Secondary | ICD-10-CM | POA: Diagnosis not present

## 2023-07-22 DIAGNOSIS — N185 Chronic kidney disease, stage 5: Secondary | ICD-10-CM | POA: Diagnosis not present

## 2023-07-22 DIAGNOSIS — K861 Other chronic pancreatitis: Secondary | ICD-10-CM | POA: Diagnosis not present

## 2023-07-22 DIAGNOSIS — I428 Other cardiomyopathies: Secondary | ICD-10-CM | POA: Diagnosis present

## 2023-07-22 DIAGNOSIS — Z8719 Personal history of other diseases of the digestive system: Secondary | ICD-10-CM | POA: Diagnosis not present

## 2023-07-22 DIAGNOSIS — Z794 Long term (current) use of insulin: Secondary | ICD-10-CM | POA: Diagnosis not present

## 2023-07-22 DIAGNOSIS — R0683 Snoring: Secondary | ICD-10-CM

## 2023-07-22 DIAGNOSIS — G4733 Obstructive sleep apnea (adult) (pediatric): Secondary | ICD-10-CM | POA: Diagnosis not present

## 2023-07-22 DIAGNOSIS — E669 Obesity, unspecified: Secondary | ICD-10-CM | POA: Insufficient documentation

## 2023-07-22 DIAGNOSIS — I5032 Chronic diastolic (congestive) heart failure: Secondary | ICD-10-CM | POA: Insufficient documentation

## 2023-07-22 DIAGNOSIS — Z79899 Other long term (current) drug therapy: Secondary | ICD-10-CM | POA: Diagnosis not present

## 2023-07-22 DIAGNOSIS — I10 Essential (primary) hypertension: Secondary | ICD-10-CM

## 2023-07-22 DIAGNOSIS — E119 Type 2 diabetes mellitus without complications: Secondary | ICD-10-CM

## 2023-07-22 DIAGNOSIS — E1122 Type 2 diabetes mellitus with diabetic chronic kidney disease: Secondary | ICD-10-CM | POA: Insufficient documentation

## 2023-07-22 DIAGNOSIS — I130 Hypertensive heart and chronic kidney disease with heart failure and stage 1 through stage 4 chronic kidney disease, or unspecified chronic kidney disease: Secondary | ICD-10-CM | POA: Insufficient documentation

## 2023-07-22 LAB — BASIC METABOLIC PANEL
Anion gap: 6 (ref 5–15)
BUN: 37 mg/dL — ABNORMAL HIGH (ref 6–20)
CO2: 23 mmol/L (ref 22–32)
Calcium: 8.4 mg/dL — ABNORMAL LOW (ref 8.9–10.3)
Chloride: 106 mmol/L (ref 98–111)
Creatinine, Ser: 3.05 mg/dL — ABNORMAL HIGH (ref 0.44–1.00)
GFR, Estimated: 18 mL/min — ABNORMAL LOW (ref >60)
Glucose, Bld: 110 mg/dL — ABNORMAL HIGH (ref 70–99)
Potassium: 4.1 mmol/L (ref 3.5–5.1)
Sodium: 135 mmol/L (ref 135–145)

## 2023-07-22 NOTE — Patient Instructions (Addendum)
Go over to the [facility]. Go pass the gift shop and have your blood work completed.   Start wearing your compression socks daily with removal at bedtime

## 2023-07-22 NOTE — Progress Notes (Signed)
REDS VEST READING= 39% HEIGHT MARKER=B

## 2023-07-26 ENCOUNTER — Encounter: Payer: Self-pay | Admitting: Nephrology

## 2023-07-26 ENCOUNTER — Ambulatory Visit: Payer: BLUE CROSS/BLUE SHIELD | Attending: Medical | Admitting: Medical

## 2023-07-26 NOTE — Progress Notes (Deleted)
Cardiology Office Note:    Date:  07/26/2023   ID:  Shannon Cox, DOB 08-14-1971, MRN [id number]  PCP:  Pcp, No  [facility] Cardiologist:  None  [facility] Electrophysiologist:  None   Referring MD: No ref. provider found   Chief Complaint: Hospital follow-up  History of Present Illness:    Shannon Cox is a 52 y.o. female with a hx of hypertension, diabetes type 2 on insulin, peripheral neuropathy with right foot drop with ambulatory dysfunction, CKD stage III with anemia of CKD, morbid obesity, recurrent pancreatitis who is being seen for hospital follow-up.  Patient was admitted in late September 2024 with progressive shortness of breath and orthopnea.  BNP 250 and he was started on IV Lasix.  Echo showed EF of 50 to 55%, grade 2 diastolic dysfunction, small pericardial effusion, mild MR.  Albumin low at 2.1.  Review Lasix intermittently held for worsening kidney function.  Creatinine went up to 3.68 and nephrology was consulted.  Nephrotic syndrome suspected secondary to diabetes.  Plan was for kidney biopsy.  IV Lasix was changed to torsemide 40 mg twice daily with twice weekly metolazone.  Today, BMET   Past Medical History:  Diagnosis Date   Hypertension     No past surgical history on file.  Current Medications: No outpatient medications have been marked as taking for the 07/26/23 encounter (Appointment) with Fransico Michael, Kenya Shiraishi H, PA-C.     Allergies:   Insulins, Lisinopril, and Sulfa antibiotics   Social History   Socioeconomic History   Marital status: Divorced    Spouse name: Not on file   Number of children: Not on file   Years of education: Not on file   Highest education level: Not on file  Occupational History   Not on file  Tobacco Use   Smoking status: Never   Smokeless tobacco: Never  Substance and Sexual Activity   Alcohol use: Not Currently   Drug use: Never   Sexual activity: Not on file  Other Topics Concern   Not on  file  Social History Narrative   Not on file   Social Determinants of Health   Financial Resource Strain: Low Risk  (03/18/2021)   Received from [facility], [facility]   Overall Financial Resource Strain (CARDIA)    Difficulty of Paying Living Expenses: Not very hard  Food Insecurity: No Food Insecurity (07/07/2023)   Hunger Vital Sign    Worried About Running Out of Food in the Last Year: Never true    Ran Out of Food in the Last Year: Never true  Transportation Needs: No Transportation Needs (07/07/2023)   PRAPARE - Administrator, Civil Service (Medical): No    Lack of Transportation (Non-Medical): No  Physical Activity: Inactive (03/18/2021)   Received from [facility], [facility]   Exercise Vital Sign    Days of Exercise per Week: 0 days    Minutes of Exercise per Session: 0 min  Stress: Stress Concern Present (03/18/2021)   Received from [facility], [facility]   [employer] of Occupational Health - Occupational Stress Questionnaire    Feeling of Stress : Rather much  Social Connections: Unknown (03/18/2021)   Received from [facility], [facility]   Social Connection and Isolation Panel [NHANES]    Frequency of Communication with Friends and Family: More than three times a week  Frequency of Social Gatherings with Friends and Family: More than three times a week    Attends Religious Services: Never    Database administrator or Organizations: No    Attends Banker Meetings: Never    Marital Status: Patient declined     Family History: The patient's ***family history is not on file.  ROS:   Please see the history of present illness.    *** All other systems reviewed and are negative.  EKGs/Labs/Other Studies Reviewed:    The following studies were reviewed today: ***  EKG:  EKG is ***  ordered today.  The ekg ordered today demonstrates ***  Recent Labs: 06/30/2023: B Natriuretic Peptide 250.6 07/04/2023: Magnesium 2.1 07/05/2023: ALT 11 07/08/2023: Hemoglobin 9.0; Platelets 334 07/22/2023: BUN 37; Creatinine, Ser 3.05; Potassium 4.1; Sodium 135  Recent Lipid Panel No results found for: "CHOL", "TRIG", "HDL", "CHOLHDL", "VLDL", "LDLCALC", "LDLDIRECT"   Risk Assessment/Calculations:   {Does this patient have ATRIAL FIBRILLATION?[phone]}   Physical Exam:    VS:  LMP 04/05/2023 (Approximate)     Wt Readings from Last 3 Encounters:  07/22/23 248 lb (112.5 kg)  07/13/23 256 lb 4 oz (116.2 kg)  07/07/23 264 lb (119.7 kg)     GEN: *** Well nourished, well developed in no acute distress HEENT: Normal NECK: No JVD; No carotid bruits LYMPHATICS: No lymphadenopathy CARDIAC: ***RRR, no murmurs, rubs, gallops RESPIRATORY:  Clear to auscultation without rales, wheezing or rhonchi  ABDOMEN: Soft, non-tender, non-distended MUSCULOSKELETAL:  No edema; No deformity  SKIN: Warm and dry NEUROLOGIC:  Alert and oriented x 3 PSYCHIATRIC:  Normal affect   ASSESSMENT:    No diagnosis found. PLAN:    In order of problems listed above:  ***  Disposition: Follow up {follow up:15908} with ***   Shared Decision Making/Informed Consent   {Are you ordering a CV Procedure (e.g. stress test, cath, DCCV, TEE, etc)?   Press F2        :[id number]}    Signed, Geriann Lafont David Stall, PA-C  07/26/2023 7:41 AM    [facility]

## 2023-08-16 ENCOUNTER — Encounter: Payer: Self-pay | Admitting: Medical

## 2023-08-16 ENCOUNTER — Ambulatory Visit: Payer: BLUE CROSS/BLUE SHIELD | Attending: Medical | Admitting: Medical

## 2023-08-16 VITALS — BP 143/82 | HR 85 | Ht 65.0 in | Wt 247.0 lb

## 2023-08-16 DIAGNOSIS — R079 Chest pain, unspecified: Secondary | ICD-10-CM | POA: Diagnosis not present

## 2023-08-16 DIAGNOSIS — R0789 Other chest pain: Secondary | ICD-10-CM

## 2023-08-16 DIAGNOSIS — I5032 Chronic diastolic (congestive) heart failure: Secondary | ICD-10-CM

## 2023-08-16 DIAGNOSIS — I10 Essential (primary) hypertension: Secondary | ICD-10-CM | POA: Diagnosis not present

## 2023-08-16 DIAGNOSIS — N185 Chronic kidney disease, stage 5: Secondary | ICD-10-CM

## 2023-08-16 DIAGNOSIS — R7989 Other specified abnormal findings of blood chemistry: Secondary | ICD-10-CM

## 2023-08-16 DIAGNOSIS — E782 Mixed hyperlipidemia: Secondary | ICD-10-CM

## 2023-08-16 MED ORDER — ISOSORBIDE MONONITRATE ER 60 MG PO TB24: 90.0000 mg | ORAL_TABLET | Freq: Every day | ORAL | 3 refills | Status: DC

## 2023-08-16 NOTE — Patient Instructions (Signed)
Medication Instructions:  Your physician recommends the following medication changes.  INCREASE: Imdur 90 mg daily   *If you need a refill on your cardiac medications before your next appointment, please call your pharmacy*   Lab Work: Your provider would like for you to have following labs drawn today CBC.   If you have labs (blood work) drawn today and your tests are completely normal, you will receive your results only by: MyChart Message (if you have MyChart) OR A paper copy in the mail If you have any lab test that is abnormal or we need to change your treatment, we will call you to review the results.   Testing/Procedures:   You are scheduled for Cardiac MRI at the location below.     [facility] [address] [place], [place] [address] [phone] Please go to the [facility] and check-in with the desk attendant.   Magnetic resonance imaging (MRI) is a painless test that produces images of the inside of the body without using Xrays.  During an MRI, strong magnets and radio waves work together in a Data processing manager to form detailed images.   MRI images may provide more details about a medical condition than X-rays, CT scans, and ultrasounds can provide.  You may be given earphones to listen for instructions.  You may eat a light breakfast and take medications as ordered with the exception of furosemide, hydrochlorothiazide, or spironolactone(fluid pill, other). If you are undergoing a stress MRI, please avoid stimulants for 12 hr prior to test. (Ie. Caffeine, nicotine, chocolate, or antihistamine medications)  An IV will be inserted into one of your veins. Contrast material will be injected into your IV. It will leave your body through your urine within a day. You may be told to drink plenty of fluids to help flush the contrast material out of your system.  You will be asked to remove all metal, including: Watch, jewelry, and other metal  objects including hearing aids, hair pieces and dentures. Also wearable glucose monitoring systems (ie. Freestyle Libre and Omnipods) (Braces and fillings normally are not a problem.)   TEST WILL TAKE APPROXIMATELY 1 HOUR  PLEASE NOTIFY SCHEDULING AT LEAST 24 HOURS IN ADVANCE IF YOU ARE UNABLE TO KEEP YOUR APPOINTMENT. [phone]  For more information and frequently asked questions, please visit our website : [email or web address]  Please call the Cardiac Imaging Nurse Navigators with any questions/concerns. [phone] Office     Follow-Up: At [facility], you and your health needs are our priority.  As part of our continuing mission to provide you with exceptional heart care, we have created designated Provider Care Teams.  These Care Teams include your primary Cardiologist (physician) and Advanced Practice Providers (APPs -  Physician Assistants and Nurse Practitioners) who all work together to provide you with the care you need, when you need it.  We recommend signing up for the patient portal called "MyChart".  Sign up information is provided on this After Visit Summary.  MyChart is used to connect with patients for Virtual Visits (Telemedicine).  Patients are able to view lab/test results, encounter notes, upcoming appointments, etc.  Non-urgent messages can be sent to your provider as well.   To learn more about what you can do with MyChart, go to [email or web address].    Your next appointment:   2 month(s)  Provider:   You may see Cadence Lorna Few  Your physician has requested that you have a cardiac MRI. Cardiac MRI uses  a computer to create images of your heart as its beating, producing both still and moving pictures of your heart and major blood vessels. For further information please visit [email or web address]. Please follow the instruction sheet given to you today for more information.

## 2023-08-16 NOTE — Progress Notes (Signed)
Cardiology Office Note:    Date:  08/16/2023   ID:  Shannon Cox, DOB [birth year], MRN [id number]  PCP:  Pcp, No  [facility] Cardiologist:  None  [facility] Electrophysiologist:  None   Referring MD: No ref. provider found   Chief Complaint: Hospital follow-up  History of Present Illness:    Shannon Cox is a 52 y.o. 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  Patient was hospitalized in June 2024 due to generalized abdominal pain and acute cystitis with hematuria.  BNP 168.  Echo showed normal pump function with small pericardial effusion.  Patient was discharged on Lasix for lower leg edema.  Patient presented to [facility] on 07/01/2023 with progressive shortness of breath.  BNP 250.  High-sensitivity troponin 22, 27.  Serum creatinine was 3.33 (creatinine 1.28 eight months ago). blood pressure was severely elevated.  Patient was started on IV Lasix and IV heparin.  Some lower leg edema due to hypoalbuminemia.  Echo showed EF of 50 to 55%, moderate LVH, grade 2 diastolic dysfunction, normal RV, mild MR, right atrial pressure 8 mmHg with mild LAE.  Patient was started on hydralazine, Imdur and Lopressor.  Nephrology following AKI was related to uncontrolled diabetes, hypertension, obesity, and the use of nonsteroidals.  Nephrotoxic medications were held.  Patient was treated for a UTI.  Patient was sent home on torsemide 40 mg twice daily with twice weekly metolazone.  Today, the patient is taking lasix instead of Torsemide. She said she never took Torsemide. She saw [facility] and lasix was increased to 60mg  daily. She is taking metolazone 2.5mg  as needed. She says swelling is stable, L>R. She denies chest pain or SOB.   Past Medical History:  Diagnosis Date    Hypertension     History reviewed. No pertinent surgical history.  Current Medications: Current Meds  Medication Sig   cetirizine (ZYRTEC) 10 MG tablet Take 10 mg by mouth daily.   Cholecalciferol 1.25 MG (50000 UT) capsule Take 50,000 Units by mouth once a week.   diclofenac Sodium (VOLTAREN) 1 % GEL Apply 2 g topically 4 (four) times daily.   folic acid (FOLVITE) 1 MG tablet Take 1 tablet (1 mg total) by mouth daily.   furosemide (LASIX) 20 MG tablet Take 3 tablets (60 mg total) by mouth daily.   glipiZIDE (GLUCOTROL XL) 5 MG 24 hr tablet Take 1 tablet by mouth 2 (two) times daily.   hydrALAZINE (APRESOLINE) 25 MG tablet Take 1 tablet by mouth 2 (two) times daily with a meal.   isosorbide mononitrate (IMDUR) 60 MG 24 hr tablet Take 1 tablet (60 mg total) by mouth daily.   meclizine (ANTIVERT) 25 MG tablet Take 1 tablet (25 mg total) by mouth 3 (three) times daily as needed for dizziness.   metolazone (ZAROXOLYN) 2.5 MG tablet Take 1 tablet (2.5 mg total) by mouth 2 (two) times a week.   Metoprolol Tartrate 75 MG TABS Take 1 tablet (75 mg total) by mouth 2 (two) times daily.   pantoprazole (PROTONIX) 20 MG tablet Take 1 tablet (20 mg total) by mouth daily.   potassium chloride SA (KLOR-CON M) 20 MEQ tablet Take 1 tablet (20 mEq total) by mouth daily.   rosuvastatin (CRESTOR) 20 MG tablet Take 1 tablet (20 mg total) by mouth daily.  TRESIBA FLEXTOUCH 200 UNIT/ML FlexTouch Pen Inject 25 Units into the skin daily.     Allergies:   Insulins, Lisinopril, and Sulfa antibiotics   Social History   Socioeconomic History   Marital status: Divorced    Spouse name: Not on file   Number of children: Not on file   Years of education: Not on file   Highest education level: Not on file  Occupational History   Not on file  Tobacco Use   Smoking status: Never   Smokeless tobacco: Never  Substance and Sexual Activity   Alcohol use: Not Currently   Drug use: Never   Sexual activity: Not on  file  Other Topics Concern   Not on file  Social History Narrative   Not on file   Social Determinants of Health   Financial Resource Strain: Low Risk  (03/18/2021)   Received from [facility], [facility]   Overall Financial Resource Strain (CARDIA)    Difficulty of Paying Living Expenses: Not very hard  Food Insecurity: No Food Insecurity (07/07/2023)   Hunger Vital Sign    Worried About Running Out of Food in the Last Year: Never true    Ran Out of Food in the Last Year: Never true  Transportation Needs: No Transportation Needs (07/07/2023)   PRAPARE - Administrator, Civil Service (Medical): No    Lack of Transportation (Non-Medical): No  Physical Activity: Inactive (03/18/2021)   Received from [facility], [facility]   Exercise Vital Sign    Days of Exercise per Week: 0 days    Minutes of Exercise per Session: 0 min  Stress: Stress Concern Present (03/18/2021)   Received from [facility], [facility]   [employer] of Occupational Health - Occupational Stress Questionnaire    Feeling of Stress : Rather much  Social Connections: Unknown (03/18/2021)   Received from [facility], [facility]   Social Connection and Isolation Panel [NHANES]    Frequency of Communication with Friends and Family: More than three times a week    Frequency of Social Gatherings with Friends and Family: More than three times a week    Attends Religious Services: Never    Database administrator or Organizations: No    Attends Banker Meetings: Never    Marital Status: Patient declined     Family History: The patient's family history is not on file.  ROS:   Please see the history of present illness.     All other systems reviewed and are negative.  EKGs/Labs/Other Studies Reviewed:    The following studies were reviewed  today:  Echo 06/2023 1. Left ventricular ejection fraction, by estimation, is 50 to 55%. The  left ventricle has low normal function. The left ventricle has no regional  wall motion abnormalities. There is moderate left ventricular hypertrophy.  Left ventricular diastolic  parameters are consistent with Grade II diastolic dysfunction  (pseudonormalization).   2. Right ventricular systolic function is normal. The right ventricular  size is normal.   3. A small pericardial effusion is present.1.17 cm off the LV free wall,  0.8 cm off the RV free wall.   4. The mitral valve is normal in structure. Mild mitral valve  regurgitation. No evidence of mitral stenosis.   5. The aortic valve is normal in structure. Aortic valve regurgitation is  not visualized. No aortic stenosis is  present.   6. The inferior vena cava is dilated in size with >50% respiratory  variability, suggesting right atrial pressure of 8 mmHg.   7. Left atrial size was mildly dilated.   EKG:  EKG is ordered today.  The ekg ordered today demonstrates NSR 85bpm, LAD, nonspecific T wave changes  Recent Labs: 06/30/2023: B Natriuretic Peptide 250.6 07/04/2023: Magnesium 2.1 07/05/2023: ALT 11 07/08/2023: Hemoglobin 9.0; Platelets 334 07/22/2023: BUN 37; Creatinine, Ser 3.05; Potassium 4.1; Sodium 135  Recent Lipid Panel No results found for: "CHOL", "TRIG", "HDL", "CHOLHDL", "VLDL", "LDLCALC", "LDLDIRECT"   Physical Exam:    VS:  BP (!) 143/82 (BP Location: Right Arm, Patient Position: Sitting, Cuff Size: Large)   Pulse 85   Ht 5\' 5"  (1.651 m)   Wt 247 lb (112 kg)   SpO2 99%   BMI 41.10 kg/m     Wt Readings from Last 3 Encounters:  08/16/23 247 lb (112 kg)  07/22/23 248 lb (112.5 kg)  07/13/23 256 lb 4 oz (116.2 kg)     GEN:  Well nourished, well developed in no acute distress HEENT: Normal NECK: No JVD; No carotid bruits LYMPHATICS: No lymphadenopathy CARDIAC: RRR, no murmurs, rubs, gallops RESPIRATORY:   Clear to auscultation without rales, wheezing or rhonchi  ABDOMEN: Soft, non-tender, non-distended MUSCULOSKELETAL:  1+ lower leg edema; No deformity  SKIN: Warm and dry NEUROLOGIC:  Alert and oriented x 3 PSYCHIATRIC:  Normal affect   ASSESSMENT:    1. Chronic diastolic heart failure (HCC)   2. Elevated troponin   3. Chest pain of uncertain etiology   4. Essential hypertension   5. Hyperlipidemia, mixed   6. CKD (chronic kidney disease) stage 5, GFR less than 15 ml/min (HCC)    PLAN:    In order of problems listed above:  Diastolic heart failure Patient has seen Clarisa Kindred twice since discharge from the hospital.  Discharge summary reports torsemide 40 mg twice daily with metolazone 2.5 mg twice weekly, however she has been on Lasix since discharge.  She reported Clarisa Kindred increase Lasix to 60 mg daily and nephrology changed metolazone to 2.5 mg as needed.  Patient has lower leg edema on exam, however she reports this is stable.  She is not taking her weights daily.  She is working on a low-salt diet.  No changes to Lasix today.  She has follow-up with Clarisa Kindred later this month.  Patient is not candidate for ACE/ARB due to CKD.  She may be able to tolerate SGLT2 inhibitor, will defer to nephrology.  Elevated troponin/Chest pain High-sensitivity troponin minimally elevated in the hospital.  She reports occasional chest pain episodes that are improved with Imdur.  I will order a stress PET and increase Imdur to 90 mg daily.  HTN Blood pressure today is 143/82.  She is taking hydralazine 25 mg twice a day and metoprolol 75 mg twice a day.  I will increase Imdur as above.  Hyperlipidemia LDL 132, total cholesterol 208, HDL 42, TG 172. Continue Crestor 20 mg daily.  Can recheck fasting lipids and LFTs at follow-up.  Nephrotic syndrome CKD stage 5 Baseline creatinine was 1.6.  Patient had AKI during recent hospitalization.  Discharge creatinine at 3.79.  UA showed protein.  She  has been following with nephrology.  Disposition: Follow up in 2 month(s) with MD/APP     Signed, Cylan Borum David Stall, PA-C  08/16/2023 10:53 AM    [facility]

## 2023-08-17 LAB — CBC
Hematocrit: 34.6 % (ref 34.0–46.6)
Hemoglobin: 10.5 g/dL — ABNORMAL LOW (ref 11.1–15.9)
MCH: 23.5 pg — ABNORMAL LOW (ref 26.6–33.0)
MCHC: 30.3 g/dL — ABNORMAL LOW (ref 31.5–35.7)
MCV: 77 fL — ABNORMAL LOW (ref 79–97)
Platelets: 357 10*3/uL (ref 150–450)
RBC: 4.47 x10E6/uL (ref 3.77–5.28)
RDW: 17.3 % — ABNORMAL HIGH (ref 11.7–15.4)
WBC: 7.3 10*3/uL (ref 3.4–10.8)

## 2023-08-19 ENCOUNTER — Telehealth: Payer: Self-pay

## 2023-08-19 NOTE — Addendum Note (Signed)
Addended by: Parke Poisson on: 08/19/2023 08:53 AM   Modules accepted: Orders

## 2023-08-19 NOTE — Telephone Encounter (Signed)
Per Cadence, the recommendation was to cancel the stress MRI and replace it with a Cardiac PET scan. Orders have been placed, and the patient has been informed. The patient was also made aware of the following instructions:  How to Prepare for Your Cardiac PET/CT Stress Test:  1. Please do not take these medications before your test:   Medications that may interfere with the cardiac pharmacological stress agent (ex. nitrates - including erectile dysfunction medications, isosorbide mononitrate- [please start to hold this medication the day before the test], tamulosin or beta-blockers) the day of the exam. (Erectile dysfunction medication should be held for at least 72 hrs prior to test) Hold Imdur the morning of test Theophylline containing medications for 12 hours. Dipyridamole 48 hours prior to the test. Your remaining medications may be taken with water.  2. Nothing to eat or drink, except water, 3 hours prior to arrival time.   NO caffeine/decaffeinated products, or chocolate 12 hours prior to arrival.  3. NO perfume, cologne or lotion on chest or abdomen area.          - FEMALES - Please avoid wearing dresses to this appointment.  4. Total time is 1 to 2 hours; you may want to bring reading material for the waiting time.  5. Please report to Radiology at the [facility] Main Entrance 30 minutes early for your test.  [address] [place], [place] [address]  6. Please report to Radiology at [facility] Main Entrance, medical mall, 30 mins prior to your test.  [address]  [place], [place]  [phone]  Diabetic Preparation:  Hold oral medications. You may take NPH and Lantus insulin. Do not take Humalog or Humulin R (Regular Insulin) the day of your test. Check blood sugars prior to leaving the house. If able to eat breakfast prior to 3 hour fasting, you may take all medications, including your insulin, Do not worry if you miss your  breakfast dose of insulin - start at your next meal. Patients who wear a continuous glucose monitor MUST remove the device prior to scanning.  IF YOU THINK YOU MAY BE PREGNANT, OR ARE NURSING PLEASE INFORM THE TECHNOLOGIST.  In preparation for your appointment, medication and supplies will be purchased.  Appointment availability is limited, so if you need to cancel or reschedule, please call the Radiology Department at [phone] [facility]) OR [phone] [place])  24 hours in advance to avoid a cancellation fee of $100.00  What to Expect After you Arrive:  Once you arrive and check in for your appointment, you will be taken to a preparation room within the Radiology Department.  A technologist or Nurse will obtain your medical history, verify that you are correctly prepped for the exam, and explain the procedure.  Afterwards,  an IV will be started in your arm and electrodes will be placed on your skin for EKG monitoring during the stress portion of the exam. Then you will be escorted to the PET/CT scanner.  There, staff will get you positioned on the scanner and obtain a blood pressure and EKG.  During the exam, you will continue to be connected to the EKG and blood pressure machines.  A small, safe amount of a radioactive tracer will be injected in your IV to obtain a series of pictures of your heart along with an injection of a stress agent.    After your Exam:  It is recommended that you eat a meal and drink  a caffeinated beverage to counter act any effects of the stress agent.  Drink plenty of fluids for the remainder of the day and urinate frequently for the first couple of hours after the exam.  Your doctor will inform you of your test results within 7-10 business days.  For more information and frequently asked questions, please visit our website : [email or web address]  For questions about your test or how to prepare for your test, please call: Cardiac Imaging Nurse  [facility] Office: [phone]

## 2023-08-24 ENCOUNTER — Encounter: Payer: BLUE CROSS/BLUE SHIELD | Admitting: Family

## 2023-08-31 ENCOUNTER — Encounter: Payer: Self-pay | Admitting: Family

## 2023-08-31 ENCOUNTER — Ambulatory Visit: Payer: BLUE CROSS/BLUE SHIELD | Attending: Family | Admitting: Family

## 2023-08-31 VITALS — BP 149/69 | HR 86 | Ht 65.0 in | Wt 252.0 lb

## 2023-08-31 DIAGNOSIS — Z7984 Long term (current) use of oral hypoglycemic drugs: Secondary | ICD-10-CM | POA: Insufficient documentation

## 2023-08-31 DIAGNOSIS — I428 Other cardiomyopathies: Secondary | ICD-10-CM | POA: Diagnosis not present

## 2023-08-31 DIAGNOSIS — R0683 Snoring: Secondary | ICD-10-CM | POA: Diagnosis not present

## 2023-08-31 DIAGNOSIS — Z79899 Other long term (current) drug therapy: Secondary | ICD-10-CM | POA: Diagnosis not present

## 2023-08-31 DIAGNOSIS — D631 Anemia in chronic kidney disease: Secondary | ICD-10-CM | POA: Diagnosis present

## 2023-08-31 DIAGNOSIS — G4733 Obstructive sleep apnea (adult) (pediatric): Secondary | ICD-10-CM | POA: Insufficient documentation

## 2023-08-31 DIAGNOSIS — I130 Hypertensive heart and chronic kidney disease with heart failure and stage 1 through stage 4 chronic kidney disease, or unspecified chronic kidney disease: Secondary | ICD-10-CM | POA: Diagnosis not present

## 2023-08-31 DIAGNOSIS — I10 Essential (primary) hypertension: Secondary | ICD-10-CM | POA: Diagnosis not present

## 2023-08-31 DIAGNOSIS — E119 Type 2 diabetes mellitus without complications: Secondary | ICD-10-CM

## 2023-08-31 DIAGNOSIS — I5032 Chronic diastolic (congestive) heart failure: Secondary | ICD-10-CM | POA: Insufficient documentation

## 2023-08-31 DIAGNOSIS — N185 Chronic kidney disease, stage 5: Secondary | ICD-10-CM | POA: Diagnosis present

## 2023-08-31 DIAGNOSIS — K861 Other chronic pancreatitis: Secondary | ICD-10-CM | POA: Diagnosis not present

## 2023-08-31 DIAGNOSIS — Z794 Long term (current) use of insulin: Secondary | ICD-10-CM

## 2023-08-31 DIAGNOSIS — E1122 Type 2 diabetes mellitus with diabetic chronic kidney disease: Secondary | ICD-10-CM | POA: Diagnosis present

## 2023-08-31 MED ORDER — CETIRIZINE HCL 10 MG PO TABS: 10.0000 mg | ORAL_TABLET | Freq: Every day | ORAL | 11 refills | Status: AC

## 2023-08-31 NOTE — Progress Notes (Signed)
Advanced Heart Failure Clinic Note   PCP: Pcp, No Cardiologist: Fransico Michael, Cadence, PA (last seen 11/24)  HPI:  Shannon Cox is a 52 y/o female with a history of anemia, CKD stage 4-5, IDDM, obesity, OSA, HTN, recurrent pancreatitis, gastritis and chronic heart failure.   Admitted 03/17/23 due to nausea a week ago, and had one episode of diarrhea 3 days ago, then started to have epigastric pain since that time. Her pain was stabbing in nature, located on epigastric and LUQ area, radiating to her mid back, associated with nausea, diarrhea, not with vomiting, moderately relieved with home oral opoid pain medications, no aggravating factors, constant. Reported some chills, but denied fever, LOC, fall, headache, dizziness, chest pain or pressure, SOB, cough, sputum production, hemoptysis, hematemesis, hematochezia, melena, dysuria, hematuria, or focal weakness. BP elevated. Urine + UTI. EKG was not remarkable for acute ischemia, CXR showed no active lesions except cardiomegaly, CT AP showed small pericardial effusion, small retroperitoneal fluids, normal pancreas, diverticulosis without inflammation. IVF given. GI symptoms improved on own. Empiric trial of PPI for now given patient-reported delay in arranging endoscopy. Significant hypoalbuminemia of 1.9 and 3+ protein on UA. TTE this admission with normal EF. Small pericardial effusion without hemodynamic significance.     Admitted 06/30/23 due to shortness of breath that acutely worsened on the evening of presentation while lying flat, improved somewhat by sitting upright. She had an upper endoscopy the day prior at [facility] that showed gastritis, biopsies pending. She reports that her lower extremity edema has been worsening for the past week. Troponin 27 and BNP 250.  EKG showed NSR at 95 with no acute ST-T wave changes. Chest x-ray shows cardiomegaly with no active cardiopulmonary disease. IV diuresed. Urinalysis positive for UTI, urine protein> 3000, nephrotic  range. Nephrology consulted, recommended renal biopsy, IR consulted, s/p left renal biopsy done 10/1. Antibiotics given for UTI.   Echo 03/18/23: EF >55% with moderate LVH, mild LAE, mild MR/ TR Echo 07/01/23: EF 50-55% with moderate LVH, Grade II DD, small pericardial effusion, mild MR  She presents today for a HF follow-up visit with a chief complaint of moderate fatigue with minimal exertion. Chronic in nature. Has associated minimal shortness of breath, intermittent chest pain/ palpitations, dizziness, nausea, pedal edema and chronic difficulty sleeping along with this. Denies cough, abdominal distention or weight gain.   Is starting the process of a possible kidney transplant and has to watch a video on this. Had a sleep study done ~ 08/24 with her PCP office but says that that office is closed and she has been unable to get her results. She is unsure if the office is closed permanently or not. She says that she has woken herself up with apneic episodes and wakes up feeling just as tired as when she went to sleep.    ROS: All systems negative except as listed in HPI, PMH and Problem List.  SH:  Social History   Socioeconomic History   Marital status: Divorced    Spouse name: Not on file   Number of children: Not on file   Years of education: Not on file   Highest education level: Not on file  Occupational History   Not on file  Tobacco Use   Smoking status: Never   Smokeless tobacco: Never  Substance and Sexual Activity   Alcohol use: Not Currently   Drug use: Never   Sexual activity: Not on file  Other Topics Concern   Not on file  Social  History Narrative   Not on file   Social Determinants of Health   Financial Resource Strain: Low Risk  (03/18/2021)   Received from [facility], [facility]   Overall Financial Resource Strain (CARDIA)    Difficulty of Paying Living Expenses: Not very hard  Food Insecurity: No Food Insecurity (07/07/2023)    Hunger Vital Sign    Worried About Running Out of Food in the Last Year: Never true    Ran Out of Food in the Last Year: Never true  Transportation Needs: No Transportation Needs (07/07/2023)   PRAPARE - Administrator, Civil Service (Medical): No    Lack of Transportation (Non-Medical): No  Physical Activity: Inactive (03/18/2021)   Received from [facility], [facility]   Exercise Vital Sign    Days of Exercise per Week: 0 days    Minutes of Exercise per Session: 0 min  Stress: Stress Concern Present (03/18/2021)   Received from [facility], [facility]   [employer] of Occupational Health - Occupational Stress Questionnaire    Feeling of Stress : Rather much  Social Connections: Unknown (03/18/2021)   Received from [facility], [facility]   Social Connection and Isolation Panel [NHANES]    Frequency of Communication with Friends and Family: More than three times a week    Frequency of Social Gatherings with Friends and Family: More than three times a week    Attends Religious Services: Never    Database administrator or Organizations: No    Attends Banker Meetings: Never    Marital Status: Patient declined  Catering manager Violence: Not At Risk (07/07/2023)   Humiliation, Afraid, Rape, and Kick questionnaire    Fear of Current or Ex-Partner: No    Emotionally Abused: No    Physically Abused: No    Sexually Abused: No    FH: No family history on file.  Past Medical History:  Diagnosis Date   Hypertension     Current Outpatient Medications  Medication Sig Dispense Refill   cetirizine (ZYRTEC) 10 MG tablet Take 10 mg by mouth daily.     Cholecalciferol 1.25 MG (50000 UT) capsule Take 50,000 Units by mouth once a week.     diclofenac Sodium (VOLTAREN) 1 % GEL Apply 2 g topically 4 (four) times daily.     folic acid (FOLVITE) 1 MG tablet  Take 1 tablet (1 mg total) by mouth daily. 30 tablet 2   furosemide (LASIX) 20 MG tablet Take 3 tablets (60 mg total) by mouth daily. 270 tablet 3   glipiZIDE (GLUCOTROL XL) 5 MG 24 hr tablet Take 1 tablet by mouth 2 (two) times daily.     hydrALAZINE (APRESOLINE) 25 MG tablet Take 1 tablet by mouth 2 (two) times daily with a meal.     isosorbide mononitrate (IMDUR) 60 MG 24 hr tablet Take 1.5 tablets (90 mg total) by mouth daily. 90 tablet 3   meclizine (ANTIVERT) 25 MG tablet Take 1 tablet (25 mg total) by mouth 3 (three) times daily as needed for dizziness. 30 tablet 0   metolazone (ZAROXOLYN) 2.5 MG tablet Take 1 tablet (2.5 mg total) by mouth 2 (two) times a week. 8 tablet 2   Metoprolol Tartrate 75 MG TABS Take 1 tablet (75 mg total) by mouth 2 (two) times daily. 30 tablet 2   pantoprazole (PROTONIX) 20 MG tablet Take  1 tablet (20 mg total) by mouth daily. 30 tablet 1   potassium chloride SA (KLOR-CON M) 20 MEQ tablet Take 1 tablet (20 mEq total) by mouth daily. 90 tablet 3   rosuvastatin (CRESTOR) 20 MG tablet Take 1 tablet (20 mg total) by mouth daily. 30 tablet 2   TRESIBA FLEXTOUCH 200 UNIT/ML FlexTouch Pen Inject 25 Units into the skin daily.     No current facility-administered medications for this visit.   Vitals:   08/31/23 1023 08/31/23 1027  BP: (!) 161/69 (!) 149/69  Pulse: 87 86  SpO2: 100% 100%  Weight: 252 lb (114.3 kg)   Height: 5\' 5"  (1.651 m)    Wt Readings from Last 3 Encounters:  08/31/23 252 lb (114.3 kg)  08/16/23 247 lb (112 kg)  07/22/23 248 lb (112.5 kg)   Lab Results  Component Value Date   CREATININE 3.05 (H) 07/22/2023   CREATININE 3.79 (H) 07/08/2023   CREATININE 3.90 (H) 07/07/2023    PHYSICAL EXAM:  General:  Well appearing. No resp difficulty HEENT: normal Neck: supple. JVP flat. No lymphadenopathy or thryomegaly appreciated. Cor: PMI normal. Regular rate & rhythm. No rubs, gallops or murmurs. Lungs: clear Abdomen: soft, nontender,  nondistended. No hepatosplenomegaly. No bruits or masses.  Extremities: no cyanosis, clubbing, rash, 1+ pitting edema bilaterally to mid-shin Neuro: alert & orientedx3, cranial nerves grossly intact. Moves all 4 extremities w/o difficulty. Affect pleasant.   ECG: not done   ASSESSMENT & PLAN:  1: NICM with preserved ejection fraction- - suspect due to uncontrolled HTN - NYHA class III - minimally fluid overload but stable symptoms - weighing daily; reminded to call for overnight weight gain of > 2 pounds or a weekly weight gain of > 5 pounds - weight up 4 pounds from last visit here 5 weeks ago - Echo 03/18/23: EF >55% with moderate LVH, mild LAE, mild MR/ TR - Echo 07/01/23: EF 50-55% with moderate LVH, Grade II DD, small pericardial effusion, mild MR - continue metolazone 2.5mg  2x/ weekly - continue metoprolol tartrate 75mg  BID - continue furosemide 60mg  daily - continue potassium daily - saw cardiology Fransico Michael) 11/24 - due to renal function, unable to use SGLT2, MRA, ARB/ ARNI - encouraged to start wearing compression socks daily with removal at bedtime - BNP 06/30/23 was 250.6  2: HTN- - BP 149/69 - continue hydralazine 25mg  BID - continue isosorbide [place] 90mg  daily - emphasized getting PCP appt rescheduled - BMP 07/22/23 showed sodium 135, potassium 4.1, creatinine 3.05 & GFR 18  3: DM- - A1c 07/01/23 was 6.9% - saw endocrinology (Coviello) 05/24 - continue glipizide 5mg  BID  4: CKD- - saw nephrology Eulah Pont) 11/24 - BMP 07/22/23 showed sodium 135, potassium 4.1, creatinine 3.05 & GFR 18 - says that she's starting the process of kidney transplant & has to watch a video on this - transplant center number provided as she's not sure she can find the video in her mychart; if she can't find it, she will contact them  5: Snoring- - she says that she still hasn't been able to get her sleep study results - discussed that if she's unable to the get the results, we may have  to repeat the sleep study - she will keep [place] updated; if we need to reorder this, will order [place] home sleep study  Return in 4 months, sooner if needed.

## 2023-08-31 NOTE — Patient Instructions (Addendum)
  [facility] [place]  [address]  [place], [place] [address]  Harley Hallmark [phone]

## 2023-09-24 ENCOUNTER — Other Ambulatory Visit (HOSPITAL_COMMUNITY): Payer: BLUE CROSS/BLUE SHIELD

## 2023-10-19 ENCOUNTER — Ambulatory Visit: Payer: BLUE CROSS/BLUE SHIELD | Attending: Medical | Admitting: Medical

## 2023-10-19 NOTE — Progress Notes (Deleted)
 Cardiology Office Note:    Date:  10/19/2023   ID:  Shannon Cox, DOB Mar 22, 1971, MRN [id number]  PCP:  Pcp, No  [facility] Cardiologist:  None  [facility] Electrophysiologist:  None   Referring MD: No ref. provider found   Chief Complaint: ***  History of Present Illness:    Shannon Cox is a 53 y.o. 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   Patient was hospitalized in June 2024 due to generalized abdominal pain and acute cystitis with hematuria.  BNP 168.  Echo showed normal pump function with small pericardial effusion.  Patient was discharged on Lasix  for lower leg edema.   Patient presented to [facility] on 07/01/2023 with progressive shortness of breath.  BNP 250.  High-sensitivity troponin 22, 27.  Serum creatinine was 3.33 (creatinine 1.28 eight months ago). blood pressure was severely elevated.  Patient was started on IV Lasix  and IV heparin.  Some lower leg edema due to hypoalbuminemia.  Echo showed EF of 50 to 55%, moderate LVH, grade 2 diastolic dysfunction, normal RV, mild MR, right atrial pressure 8 mmHg with mild LAE.  Patient was started on hydralazine , Imdur  and Lopressor .  Nephrology following AKI was related to uncontrolled diabetes, hypertension, obesity, and the use of nonsteroidals.  Nephrotoxic medications were held.  Patient was treated for a UTI.  Patient was sent home on torsemide  40 mg twice daily with twice weekly metolazone .  The patient was last seen 08/2023 and was taking lasix  in place of Torsemide . Ellouise class increased lasix  to 60mg  daily and metolazone  2.5mg  as needed.   Past Medical History:  Diagnosis Date   Hypertension     No past surgical history on file.  Current Medications: No outpatient medications have been  marked as taking for the 10/19/23 encounter (Appointment) with Franchester, Markesia Crilly H, PA-C.     Allergies:   Insulins, Lisinopril, and Sulfa  antibiotics   Social History   Socioeconomic History   Marital status: Divorced    Spouse name: Not on file   Number of children: Not on file   Years of education: Not on file   Highest education level: Not on file  Occupational History   Not on file  Tobacco Use   Smoking status: Never   Smokeless tobacco: Never  Substance and Sexual Activity   Alcohol use: Not Currently   Drug use: Never   Sexual activity: Not on file  Other Topics Concern   Not on file  Social History Narrative   Not on file   Social Drivers of Health   Financial Resource Strain: Low Risk  (03/18/2021)   Received from [facility], [facility]   Overall Financial Resource Strain (CARDIA)    Difficulty of Paying Living Expenses: Not very hard  Food Insecurity: No Food Insecurity (07/07/2023)   Hunger Vital Sign    Worried About Running Out of Food in the Last Year: Never true    Ran Out of Food in the Last Year: Never true  Transportation Needs: No Transportation Needs (07/07/2023)   PRAPARE - Administrator, Civil Service (Medical): No    Lack of Transportation (Non-Medical): No  Physical Activity: Inactive (03/18/2021)   Received from [facility], [facility]   Exercise Vital  Sign    Days of Exercise per Week: 0 days    Minutes of Exercise per Session: 0 min  Stress: Stress Concern Present (03/18/2021)   Received from [facility], [facility]   [employer] of Occupational Health - Occupational Stress Questionnaire    Feeling of Stress : Rather much  Social Connections: Unknown (03/18/2021)   Received from [facility], [facility]   Social Connection and Isolation Panel [NHANES]    Frequency of Communication  with Friends and Family: More than three times a week    Frequency of Social Gatherings with Friends and Family: More than three times a week    Attends Religious Services: Never    Database Administrator or Organizations: No    Attends Engineer, Structural: Never    Marital Status: Patient declined     Family History: The patient's ***family history is not on file.  ROS:   Please see the history of present illness.    *** All other systems reviewed and are negative.  EKGs/Labs/Other Studies Reviewed:    The following studies were reviewed today: ***  EKG:  EKG is *** ordered today.  The ekg ordered today demonstrates ***  Recent Labs: 06/30/2023: B Natriuretic Peptide 250.6 07/04/2023: Magnesium 2.1 07/05/2023: ALT 11 07/22/2023: BUN 37; Creatinine, Ser 3.05; Potassium 4.1; Sodium 135 08/16/2023: Hemoglobin 10.5; Platelets 357  Recent Lipid Panel No results found for: CHOL, TRIG, HDL, CHOLHDL, VLDL, LDLCALC, LDLDIRECT   Risk Assessment/Calculations:   {Does this patient have ATRIAL FIBRILLATION?[phone]}   Physical Exam:    VS:  There were no vitals taken for this visit.    Wt Readings from Last 3 Encounters:  08/31/23 252 lb (114.3 kg)  08/16/23 247 lb (112 kg)  07/22/23 248 lb (112.5 kg)     GEN: *** Well nourished, well developed in no acute distress HEENT: Normal NECK: No JVD; No carotid bruits LYMPHATICS: No lymphadenopathy CARDIAC: ***RRR, no murmurs, rubs, gallops RESPIRATORY:  Clear to auscultation without rales, wheezing or rhonchi  ABDOMEN: Soft, non-tender, non-distended MUSCULOSKELETAL:  No edema; No deformity  SKIN: Warm and dry NEUROLOGIC:  Alert and oriented x 3 PSYCHIATRIC:  Normal affect   ASSESSMENT:    No diagnosis found. PLAN:    In order of problems listed above:  ***  Disposition: Follow up {follow up:15908} with ***   Shared Decision Making/Informed Consent   {Are you ordering a CV Procedure (e.g.  stress test, cath, DCCV, TEE, etc)?   Press F2        :[id number]}    Signed, Aysia Lowder VEAR Franchester RIGGERS  10/19/2023 7:41 AM    [facility]

## 2023-11-09 ENCOUNTER — Ambulatory Visit: Payer: BLUE CROSS/BLUE SHIELD | Attending: Medical | Admitting: Medical

## 2023-11-09 ENCOUNTER — Encounter: Payer: Self-pay | Admitting: Medical

## 2023-11-09 VITALS — BP 160/82 | HR 89 | Ht 65.0 in | Wt 266.4 lb

## 2023-11-09 DIAGNOSIS — I5032 Chronic diastolic (congestive) heart failure: Secondary | ICD-10-CM | POA: Diagnosis not present

## 2023-11-09 DIAGNOSIS — I10 Essential (primary) hypertension: Secondary | ICD-10-CM | POA: Diagnosis not present

## 2023-11-09 DIAGNOSIS — E782 Mixed hyperlipidemia: Secondary | ICD-10-CM

## 2023-11-09 DIAGNOSIS — R079 Chest pain, unspecified: Secondary | ICD-10-CM | POA: Diagnosis not present

## 2023-11-09 DIAGNOSIS — N186 End stage renal disease: Secondary | ICD-10-CM

## 2023-11-09 DIAGNOSIS — G4730 Sleep apnea, unspecified: Secondary | ICD-10-CM

## 2023-11-09 MED ORDER — ISOSORBIDE MONONITRATE ER 120 MG PO TB24: 120.0000 mg | ORAL_TABLET | Freq: Every day | ORAL | 3 refills | Status: AC

## 2023-11-09 MED ORDER — ONDANSETRON HCL 4 MG PO TABS: 4.0000 mg | ORAL_TABLET | Freq: Three times a day (TID) | ORAL | 1 refills | Status: AC | PRN

## 2023-11-09 MED ORDER — METOLAZONE 2.5 MG PO TABS: 2.5000 mg | ORAL_TABLET | ORAL | 3 refills | Status: AC

## 2023-11-09 NOTE — Patient Instructions (Signed)
 Medication Instructions:  Your physician recommends the following medication changes.  INCREASE: Imdur  to 120 mg by mouth daily  We have sent refills for the following medication: Zofran  4 mg by mouth every 8 hours as needed  Metolazone  2.5 mg by mouth twice weekly   *If you need a refill on your cardiac medications before your next appointment, please call your pharmacy*   Lab Work: No labs ordered today    Testing/Procedures: No test ordered today    Follow-Up: At [facility], you and your health needs are our priority.  As part of our continuing mission to provide you with exceptional heart care, we have created designated Provider Care Teams.  These Care Teams include your primary Cardiologist (physician) and Advanced Practice Providers (APPs -  Physician Assistants and Nurse Practitioners) who all work together to provide you with the care you need, when you need it.  We recommend signing up for the patient portal called MyChart.  Sign up information is provided on this After Visit Summary.  MyChart is used to connect with patients for Virtual Visits (Telemedicine).  Patients are able to view lab/test results, encounter notes, upcoming appointments, etc.  Non-urgent messages can be sent to your provider as well.   To learn more about what you can do with MyChart, go to [email or web address].    Your next appointment:   3 month(s)  Provider:   Dr. Gollan

## 2023-11-09 NOTE — Progress Notes (Signed)
 Cardiology Office Note:  .   Date:  11/09/2023  ID:  Shannon Cox, DOB May 23, 1971, MRN [id number] PCP: Pcp, No  [facility] Providers Cardiologist:  None     History of Present Illness: Shannon Cox   [name] is a 53 y.o. 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   Patient was hospitalized in June 2024 due to generalized abdominal pain and acute cystitis with hematuria.  BNP 168.  Echo showed normal pump function with small pericardial effusion.  Patient was discharged on Lasix  for lower leg edema.   Patient presented to [facility] on 07/01/2023 with progressive shortness of breath.  BNP 250.  High-sensitivity troponin 22, 27.  Serum creatinine was 3.33 (creatinine 1.28 eight months ago). blood pressure was severely elevated.  Patient was started on IV Lasix  and IV heparin.  Some lower leg edema due to hypoalbuminemia.  Echo showed EF of 50 to 55%, moderate LVH, grade 2 diastolic dysfunction, normal RV, mild MR, right atrial pressure 8 mmHg with mild LAE.  Patient was started on hydralazine , Imdur  and Lopressor .  Nephrology following AKI was related to uncontrolled diabetes, hypertension, obesity, and the use of nonsteroidals.  Nephrotoxic medications were held.  Patient was treated for a UTI.  Patient was sent home on torsemide  40 mg twice daily with twice weekly metolazone .  The patient was last seen 08/16/23 and reported she was taking lasix  in place of Torsemide . Ellouise Class increased lasix  to 60mg  daily.  Patient had stable lower leg edema on exam.  Imdur  was increased for blood pressure control and occasional chest pain.  Today, the patient reports she was diagnosed with sleep apnea. Breathing is OK. She feels tired and nauseated most of the time d/t kidney issues.  She quit her job  in January due to stress. She had an episode of chest pressure last week while at rest. She lives with daughter. She is undergoing work-up for kidney transplant. BP is high, she did have BP meds today. She has a little bit of swelling, it is better than before. She has metolazone  to take, but only takes in once a week.    Studies Reviewed: Shannon Cox   EKG Interpretation Date/Time:  Tuesday November 09 2023 14:15:52 EST Ventricular Rate:  89 PR Interval:  138 QRS Duration:  78 QT Interval:  372 QTC Calculation: 452 R Axis:   -8  Text Interpretation: Normal sinus rhythm Minimal voltage criteria for LVH, may be normal variant ( R in aVL ) When compared with ECG of 16-Aug-2023 10:25, Criteria for Inferior infarct are no longer Present Confirmed by Franchester, Briseidy Spark ([id number]) on 11/09/2023 2:25:56 PM    Myoview lexiscan 10/29/23 IMPRESSIONS:  __________________________________________________________________  - Normal myocardial perfusion study. More specifically, no evidence of any significant ischemia or scar.  - Left ventricular systolic function is borderline with a post stress ejection fraction of 49% and a post rest ejection fraction of 57. The left ventricle is also moderately dilated.  - No significant coronary calcifications were noted on the attenuation CT.  - A small pericardial effusion is noted.   Echo 06/2023 1. Left ventricular ejection fraction, by estimation, is 50 to 55%. The  left ventricle has low normal function. The left ventricle has no regional  wall motion abnormalities. There is moderate left ventricular hypertrophy.  Left ventricular diastolic  parameters are consistent with Grade II diastolic dysfunction  (pseudonormalization).   2. Right ventricular systolic function is normal. The right ventricular  size is normal.   3. A small pericardial effusion is present.1.17 cm off the LV free wall,  0.8 cm off the RV free wall.   4. The mitral valve is normal in structure. Mild mitral  valve  regurgitation. No evidence of mitral stenosis.   5. The aortic valve is normal in structure. Aortic valve regurgitation is  not visualized. No aortic stenosis is present.   6. The inferior vena cava is dilated in size with >50% respiratory  variability, suggesting right atrial pressure of 8 mmHg.   7. Left atrial size was mildly dilated.       Physical Exam:   VS:  BP (!) 160/82 (BP Location: Left Arm, Patient Position: Sitting, Cuff Size: Large)   Pulse 89   Ht 5' 5 (1.651 m)   Wt 266 lb 6.4 oz (120.8 kg)   BMI 44.33 kg/m    Wt Readings from Last 3 Encounters:  11/09/23 266 lb 6.4 oz (120.8 kg)  08/31/23 252 lb (114.3 kg)  08/16/23 247 lb (112 kg)    GEN: Well nourished, well developed in no acute distress NECK: No JVD; No carotid bruits CARDIAC: RRR, no murmurs, rubs, gallops RESPIRATORY:  Clear to auscultation without rales, wheezing or rhonchi  ABDOMEN: Soft, non-tender, non-distended EXTREMITIES:  No edema; No deformity   ASSESSMENT AND PLAN: .    Diastolic heart failure Patient reports stable lower leg edema however it appears weight has gone up 247lbs >266lbs. She says she is eating a lot more and albumin was low at 2.5, which may be contributing to lower leg swelling. Kidney function has not improved and is undergoing preliminary work-up for kidney transplant. She is taking lasix  60mg  daily. I recommend taking metolazone  2.5mg  twice weekly.   Chest pain She noted episode of chest pain last week that was atypical. Recent myoview lexiscain at [facility] showed normal perfusion study, no evidence of significant ischemia or scar, 49% EF, moderately dilated LV, no significant coronary calcifications, small pericardial effusion noted. I will increase Imdur  to 120mg  daily. Continue Crestor .   HTN BP elevated. I will increase Imdur  to 120mg  daily as above. Continue Hydralazine  25mg BID and Metoprolol  75mg  daily.  HLD LDL 118, TG 87, total chol 183, HDL 48. Continue Crestor   20mg  daily.   ESRD Patient is undergoing pre-transplant evaluation for kidney transplant. Most recent Scr 3.99. Refill Zofran  today.   OSA Reports dx of OSA with no follow-up MD. I will refer to pulmonology for management.     Dispo: Follow-up in 3 months  Signed, Shiven Junious VEAR Fishman, PA-C

## 2023-12-13 ENCOUNTER — Telehealth: Payer: Self-pay | Admitting: Family

## 2023-12-13 NOTE — Telephone Encounter (Signed)
 Lvm to confirm appt on 12/14/23

## 2023-12-13 NOTE — Progress Notes (Deleted)
 Advanced Heart Failure Clinic Note   PCP: Pcp, No Cardiologist: Fransico Michael, Cadence, PA (last seen 11/24)  HPI:  Shannon Cox is a 53 y/o female with a history of anemia, CKD stage 4-5, IDDM, obesity, OSA, HTN, recurrent pancreatitis, gastritis and chronic heart failure.   Admitted 03/17/23 due to nausea a week ago, and had one episode of diarrhea 3 days ago, then started to have epigastric pain since that time. Her pain was stabbing in nature, located on epigastric and LUQ area, radiating to her mid back, associated with nausea, diarrhea, not with vomiting, moderately relieved with home oral opoid pain medications, no aggravating factors, constant. Reported some chills, but denied fever, LOC, fall, headache, dizziness, chest pain or pressure, SOB, cough, sputum production, hemoptysis, hematemesis, hematochezia, melena, dysuria, hematuria, or focal weakness. BP elevated. Urine + UTI. EKG was not remarkable for acute ischemia, CXR showed no active lesions except cardiomegaly, CT AP showed small pericardial effusion, small retroperitoneal fluids, normal pancreas, diverticulosis without inflammation. IVF given. GI symptoms improved on own. Empiric trial of PPI for now given patient-reported delay in arranging endoscopy. Significant hypoalbuminemia of 1.9 and 3+ protein on UA. TTE this admission with normal EF. Small pericardial effusion without hemodynamic significance.     Admitted 06/30/23 due to shortness of breath that acutely worsened on the evening of presentation while lying flat, improved somewhat by sitting upright. She had an upper endoscopy the day prior at [facility] that showed gastritis, biopsies pending. She reports that her lower extremity edema has been worsening for the past week. Troponin 27 and BNP 250.  EKG showed NSR at 95 with no acute ST-T wave changes. Chest x-ray shows cardiomegaly with no active cardiopulmonary disease. IV diuresed. Urinalysis positive for UTI, urine protein> 3000, nephrotic  range. Nephrology consulted, recommended renal biopsy, IR consulted, s/p left renal biopsy done 10/1. Antibiotics given for UTI.   Echo 03/18/23: EF >55% with moderate LVH, mild LAE, mild MR/ TR Echo 07/01/23: EF 50-55% with moderate LVH, Grade II DD, small pericardial effusion, mild MR  She presents today for a HF follow-up visit with a chief complaint of moderate fatigue with minimal exertion. Chronic in nature. Has associated minimal shortness of breath, intermittent chest pain/ palpitations, dizziness, nausea, pedal edema and chronic difficulty sleeping along with this. Denies cough, abdominal distention or weight gain.   Is starting the process of a possible kidney transplant and has to watch a video on this. Had a sleep study done ~ 08/24 with her PCP office but says that that office is closed and she has been unable to get her results. She is unsure if the office is closed permanently or not. She says that she has woken herself up with apneic episodes and wakes up feeling just as tired as when she went to sleep.    ROS: All systems negative except as listed in HPI, PMH and Problem List.  SH:  Social History   Socioeconomic History   Marital status: Divorced    Spouse name: Not on file   Number of children: Not on file   Years of education: Not on file   Highest education level: Not on file  Occupational History   Not on file  Tobacco Use   Smoking status: Never   Smokeless tobacco: Never  Substance and Sexual Activity   Alcohol use: Not Currently   Drug use: Never   Sexual activity: Not on file  Other Topics Concern   Not on file  Social  History Narrative   Not on file   Social Drivers of Health   Financial Resource Strain: Low Risk  (03/18/2021)   Received from [facility], [facility]   Overall Financial Resource Strain (CARDIA)    Difficulty of Paying Living Expenses: Not very hard  Food Insecurity: No Food Insecurity (07/07/2023)    Hunger Vital Sign    Worried About Running Out of Food in the Last Year: Never true    Ran Out of Food in the Last Year: Never true  Transportation Needs: No Transportation Needs (07/07/2023)   PRAPARE - Administrator, Civil Service (Medical): No    Lack of Transportation (Non-Medical): No  Physical Activity: Inactive (03/18/2021)   Received from [facility], [facility]   Exercise Vital Sign    Days of Exercise per Week: 0 days    Minutes of Exercise per Session: 0 min  Stress: Stress Concern Present (03/18/2021)   Received from [facility], [facility]   [employer] of Occupational Health - Occupational Stress Questionnaire    Feeling of Stress : Rather much  Social Connections: Unknown (03/18/2021)   Received from [facility], [facility]   Social Connection and Isolation Panel [NHANES]    Frequency of Communication with Friends and Family: More than three times a week    Frequency of Social Gatherings with Friends and Family: More than three times a week    Attends Religious Services: Never    Database administrator or Organizations: No    Attends Banker Meetings: Never    Marital Status: Patient declined  Catering manager Violence: Not At Risk (07/07/2023)   Humiliation, Afraid, Rape, and Kick questionnaire    Fear of Current or Ex-Partner: No    Emotionally Abused: No    Physically Abused: No    Sexually Abused: No    FH: No family history on file.  Past Medical History:  Diagnosis Date   Hypertension     Current Outpatient Medications  Medication Sig Dispense Refill   cetirizine (ZYRTEC) 10 MG tablet Take 1 tablet (10 mg total) by mouth daily. 30 tablet 11   Cholecalciferol 1.25 MG (50000 UT) capsule Take 50,000 Units by mouth once a week.     folic acid (FOLVITE) 1 MG tablet Take 1 tablet (1 mg total) by mouth daily. 30 tablet 2    furosemide (LASIX) 20 MG tablet Take 3 tablets (60 mg total) by mouth daily. 270 tablet 3   glipiZIDE (GLUCOTROL XL) 5 MG 24 hr tablet Take 1 tablet by mouth 2 (two) times daily.     hydrALAZINE (APRESOLINE) 25 MG tablet Take 1 tablet by mouth 2 (two) times daily with a meal.     isosorbide mononitrate (IMDUR) 120 MG 24 hr tablet Take 1 tablet (120 mg total) by mouth daily. 90 tablet 3   meclizine (ANTIVERT) 25 MG tablet Take 1 tablet (25 mg total) by mouth 3 (three) times daily as needed for dizziness. 30 tablet 0   metolazone (ZAROXOLYN) 2.5 MG tablet Take 1 tablet (2.5 mg total) by mouth 2 (two) times a week. 8 tablet 3   Metoprolol Tartrate 75 MG TABS Take 1 tablet (75 mg total) by mouth 2 (two) times daily. 30 tablet 2   ondansetron (ZOFRAN) 4 MG tablet Take 1 tablet (4 mg total) by mouth every 8 (eight) hours as needed for  nausea or vomiting. 20 tablet 1   pantoprazole (PROTONIX) 20 MG tablet Take 1 tablet (20 mg total) by mouth daily. 30 tablet 1   potassium chloride SA (KLOR-CON M) 20 MEQ tablet Take 1 tablet (20 mEq total) by mouth daily. 90 tablet 3   rosuvastatin (CRESTOR) 20 MG tablet Take 1 tablet (20 mg total) by mouth daily. 30 tablet 2   TRESIBA FLEXTOUCH 200 UNIT/ML FlexTouch Pen Inject 25 Units into the skin daily.     No current facility-administered medications for this visit.   There were no vitals filed for this visit.  Wt Readings from Last 3 Encounters:  11/09/23 266 lb 6.4 oz (120.8 kg)  08/31/23 252 lb (114.3 kg)  08/16/23 247 lb (112 kg)   Lab Results  Component Value Date   CREATININE 3.05 (H) 07/22/2023   CREATININE 3.79 (H) 07/08/2023   CREATININE 3.90 (H) 07/07/2023    PHYSICAL EXAM:  General:  Well appearing. No resp difficulty HEENT: normal Neck: supple. JVP flat. No lymphadenopathy or thryomegaly appreciated. Cor: PMI normal. Regular rate & rhythm. No rubs, gallops or murmurs. Lungs: clear Abdomen: soft, nontender, nondistended. No  hepatosplenomegaly. No bruits or masses.  Extremities: no cyanosis, clubbing, rash, 1+ pitting edema bilaterally to mid-shin Neuro: alert & orientedx3, cranial nerves grossly intact. Moves all 4 extremities w/o difficulty. Affect pleasant.   ECG: not done   ASSESSMENT & PLAN:  1: NICM with preserved ejection fraction- - suspect due to uncontrolled HTN - NYHA class III - minimally fluid overload but stable symptoms - weighing daily; reminded to call for overnight weight gain of > 2 pounds or a weekly weight gain of > 5 pounds - weight up 4 pounds from last visit here 5 weeks ago - Echo 03/18/23: EF >55% with moderate LVH, mild LAE, mild MR/ TR - Echo 07/01/23: EF 50-55% with moderate LVH, Grade II DD, small pericardial effusion, mild MR - continue metolazone 2.5mg  2x/ weekly - continue metoprolol tartrate 75mg  BID - continue furosemide 60mg  daily - continue potassium daily - saw cardiology Fransico Michael) 11/24 - due to renal function, unable to use SGLT2, MRA, ARB/ ARNI - encouraged to start wearing compression socks daily with removal at bedtime - BNP 06/30/23 was 250.6  2: HTN- - BP 149/69 - continue hydralazine 25mg  BID - continue isosorbide MN 90mg  daily - emphasized getting PCP appt rescheduled - BMP 07/22/23 showed sodium 135, potassium 4.1, creatinine 3.05 & GFR 18  3: DM- - A1c 07/01/23 was 6.9% - saw endocrinology (Coviello) 05/24 - continue glipizide 5mg  BID  4: CKD- - saw nephrology Eulah Pont) 11/24 - BMP 07/22/23 showed sodium 135, potassium 4.1, creatinine 3.05 & GFR 18 - says that she's starting the process of kidney transplant & has to watch a video on this - transplant center number provided as she's not sure she can find the video in her mychart; if she can't find it, she will contact them  5: Snoring- - she says that she still hasn't been able to get her sleep study results - discussed that if she's unable to the get the results, we may have to repeat the  sleep study - she will keep [place] updated; if we need to reorder this, will order [place] home sleep study  Return in 4 months, sooner if needed.

## 2023-12-14 ENCOUNTER — Encounter: Admitting: Family

## 2023-12-14 ENCOUNTER — Encounter: Payer: BLUE CROSS/BLUE SHIELD | Admitting: Family

## 2023-12-14 NOTE — Telephone Encounter (Signed)
 Patient did not show for her Heart Failure Clinic appointment on 12/14/23.

## 2024-01-05 ENCOUNTER — Telehealth: Payer: Self-pay | Admitting: Family

## 2024-01-05 NOTE — Telephone Encounter (Signed)
 Called to r/s no show appt on 12/14/23

## 2024-01-18 ENCOUNTER — Encounter: Payer: Self-pay | Admitting: Family

## 2024-01-24 LAB — SURGICAL PATHOLOGY

## 2024-02-07 NOTE — Progress Notes (Deleted)
 NO SHOW

## 2024-02-08 ENCOUNTER — Ambulatory Visit: Payer: BLUE CROSS/BLUE SHIELD | Attending: Cardiovascular Disease | Admitting: Cardiovascular Disease

## 2024-02-08 DIAGNOSIS — R0683 Snoring: Secondary | ICD-10-CM

## 2024-02-08 DIAGNOSIS — G4730 Sleep apnea, unspecified: Secondary | ICD-10-CM

## 2024-02-08 DIAGNOSIS — N186 End stage renal disease: Secondary | ICD-10-CM

## 2024-02-08 DIAGNOSIS — E782 Mixed hyperlipidemia: Secondary | ICD-10-CM

## 2024-02-08 DIAGNOSIS — N185 Chronic kidney disease, stage 5: Secondary | ICD-10-CM

## 2024-02-08 DIAGNOSIS — R079 Chest pain, unspecified: Secondary | ICD-10-CM

## 2024-02-08 DIAGNOSIS — I10 Essential (primary) hypertension: Secondary | ICD-10-CM

## 2024-02-08 DIAGNOSIS — D631 Anemia in chronic kidney disease: Secondary | ICD-10-CM

## 2024-02-08 DIAGNOSIS — I5032 Chronic diastolic (congestive) heart failure: Secondary | ICD-10-CM

## 2024-02-08 DIAGNOSIS — E119 Type 2 diabetes mellitus without complications: Secondary | ICD-10-CM

## 2024-02-14 ENCOUNTER — Telehealth: Payer: Self-pay | Admitting: Family

## 2024-02-14 NOTE — Progress Notes (Deleted)
 Advanced Heart Failure Clinic Note   PCP: Pcp, No Cardiologist: Fransico Michael, Cadence, PA (last seen 11/24)  HPI:  Shannon Cox is a 53 y/o female with a history of anemia, CKD stage 4-5, IDDM, obesity, OSA, HTN, recurrent pancreatitis, gastritis and chronic heart failure.   Admitted 03/17/23 due to nausea a week ago, and had one episode of diarrhea 3 days ago, then started to have epigastric pain since that time. Her pain was stabbing in nature, located on epigastric and LUQ area, radiating to her mid back, associated with nausea, diarrhea, not with vomiting, moderately relieved with home oral opoid pain medications, no aggravating factors, constant. Reported some chills, but denied fever, LOC, fall, headache, dizziness, chest pain or pressure, SOB, cough, sputum production, hemoptysis, hematemesis, hematochezia, melena, dysuria, hematuria, or focal weakness. BP elevated. Urine + UTI. EKG was not remarkable for acute ischemia, CXR showed no active lesions except cardiomegaly, CT AP showed small pericardial effusion, small retroperitoneal fluids, normal pancreas, diverticulosis without inflammation. IVF given. GI symptoms improved on own. Empiric trial of PPI for now given patient-reported delay in arranging endoscopy. Significant hypoalbuminemia of 1.9 and 3+ protein on UA. TTE this admission with normal EF. Small pericardial effusion without hemodynamic significance.     Admitted 06/30/23 due to shortness of breath that acutely worsened on the evening of presentation while lying flat, improved somewhat by sitting upright. She had an upper endoscopy the day prior at [facility] that showed gastritis, biopsies pending. She reports that her lower extremity edema has been worsening for the past week. Troponin 27 and BNP 250.  EKG showed NSR at 95 with no acute ST-T wave changes. Chest x-ray shows cardiomegaly with no active cardiopulmonary disease. IV diuresed. Urinalysis positive for UTI, urine protein> 3000, nephrotic  range. Nephrology consulted, recommended renal biopsy, IR consulted, s/p left renal biopsy done 10/1. Antibiotics given for UTI.   Echo 03/18/23: EF >55% with moderate LVH, mild LAE, mild MR/ TR Echo 07/01/23: EF 50-55% with moderate LVH, Grade II DD, small pericardial effusion, mild MR  She presents today for a HF follow-up visit with a chief complaint of moderate fatigue with minimal exertion. Chronic in nature. Has associated minimal shortness of breath, intermittent chest pain/ palpitations, dizziness, nausea, pedal edema and chronic difficulty sleeping along with this. Denies cough, abdominal distention or weight gain.   Is starting the process of a possible kidney transplant and has to watch a video on this. Had a sleep study done ~ 08/24 with her PCP office but says that that office is closed and she has been unable to get her results. She is unsure if the office is closed permanently or not. She says that she has woken herself up with apneic episodes and wakes up feeling just as tired as when she went to sleep.    ROS: All systems negative except as listed in HPI, PMH and Problem List.  SH:  Social History   Socioeconomic History   Marital status: Divorced    Spouse name: Not on file   Number of children: Not on file   Years of education: Not on file   Highest education level: Not on file  Occupational History   Not on file  Tobacco Use   Smoking status: Never   Smokeless tobacco: Never  Substance and Sexual Activity   Alcohol use: Not Currently   Drug use: Never   Sexual activity: Not on file  Other Topics Concern   Not on file  Social  History Narrative   Not on file   Social Drivers of Health   Financial Resource Strain: Low Risk  (03/18/2021)   Received from [facility], [facility]   Overall Financial Resource Strain (CARDIA)    Difficulty of Paying Living Expenses: Not very hard  Food Insecurity: No Food Insecurity (07/07/2023)    Hunger Vital Sign    Worried About Running Out of Food in the Last Year: Never true    Ran Out of Food in the Last Year: Never true  Transportation Needs: No Transportation Needs (07/07/2023)   PRAPARE - Administrator, Civil Service (Medical): No    Lack of Transportation (Non-Medical): No  Physical Activity: Inactive (03/18/2021)   Received from [facility], [facility]   Exercise Vital Sign    Days of Exercise per Week: 0 days    Minutes of Exercise per Session: 0 min  Stress: Stress Concern Present (03/18/2021)   Received from [facility], [facility]   [employer] of Occupational Health - Occupational Stress Questionnaire    Feeling of Stress : Rather much  Social Connections: Unknown (03/18/2021)   Received from [facility], [facility]   Social Connection and Isolation Panel [NHANES]    Frequency of Communication with Friends and Family: More than three times a week    Frequency of Social Gatherings with Friends and Family: More than three times a week    Attends Religious Services: Never    Database administrator or Organizations: No    Attends Banker Meetings: Never    Marital Status: Patient declined  Catering manager Violence: Not At Risk (07/07/2023)   Humiliation, Afraid, Rape, and Kick questionnaire    Fear of Current or Ex-Partner: No    Emotionally Abused: No    Physically Abused: No    Sexually Abused: No    FH: No family history on file.  Past Medical History:  Diagnosis Date   Hypertension     Current Outpatient Medications  Medication Sig Dispense Refill   cetirizine (ZYRTEC) 10 MG tablet Take 1 tablet (10 mg total) by mouth daily. 30 tablet 11   Cholecalciferol 1.25 MG (50000 UT) capsule Take 50,000 Units by mouth once a week.     folic acid (FOLVITE) 1 MG tablet Take 1 tablet (1 mg total) by mouth daily. 30 tablet 2    furosemide (LASIX) 20 MG tablet Take 3 tablets (60 mg total) by mouth daily. 270 tablet 3   glipiZIDE (GLUCOTROL XL) 5 MG 24 hr tablet Take 1 tablet by mouth 2 (two) times daily.     hydrALAZINE (APRESOLINE) 25 MG tablet Take 1 tablet by mouth 2 (two) times daily with a meal.     isosorbide mononitrate (IMDUR) 120 MG 24 hr tablet Take 1 tablet (120 mg total) by mouth daily. 90 tablet 3   meclizine (ANTIVERT) 25 MG tablet Take 1 tablet (25 mg total) by mouth 3 (three) times daily as needed for dizziness. 30 tablet 0   metolazone (ZAROXOLYN) 2.5 MG tablet Take 1 tablet (2.5 mg total) by mouth 2 (two) times a week. 8 tablet 3   Metoprolol Tartrate 75 MG TABS Take 1 tablet (75 mg total) by mouth 2 (two) times daily. 30 tablet 2   ondansetron (ZOFRAN) 4 MG tablet Take 1 tablet (4 mg total) by mouth every 8 (eight) hours as needed for  nausea or vomiting. 20 tablet 1   pantoprazole (PROTONIX) 20 MG tablet Take 1 tablet (20 mg total) by mouth daily. 30 tablet 1   potassium chloride SA (KLOR-CON M) 20 MEQ tablet Take 1 tablet (20 mEq total) by mouth daily. 90 tablet 3   rosuvastatin (CRESTOR) 20 MG tablet Take 1 tablet (20 mg total) by mouth daily. 30 tablet 2   TRESIBA FLEXTOUCH 200 UNIT/ML FlexTouch Pen Inject 25 Units into the skin daily.     No current facility-administered medications for this visit.   There were no vitals filed for this visit.  Wt Readings from Last 3 Encounters:  11/09/23 266 lb 6.4 oz (120.8 kg)  08/31/23 252 lb (114.3 kg)  08/16/23 247 lb (112 kg)   Lab Results  Component Value Date   CREATININE 3.05 (H) 07/22/2023   CREATININE 3.79 (H) 07/08/2023   CREATININE 3.90 (H) 07/07/2023    PHYSICAL EXAM:  General:  Well appearing. No resp difficulty HEENT: normal Neck: supple. JVP flat. No lymphadenopathy or thryomegaly appreciated. Cor: PMI normal. Regular rate & rhythm. No rubs, gallops or murmurs. Lungs: clear Abdomen: soft, nontender, nondistended. No  hepatosplenomegaly. No bruits or masses.  Extremities: no cyanosis, clubbing, rash, 1+ pitting edema bilaterally to mid-shin Neuro: alert & orientedx3, cranial nerves grossly intact. Moves all 4 extremities w/o difficulty. Affect pleasant.   ECG: not done   ASSESSMENT & PLAN:  1: NICM with preserved ejection fraction- - suspect due to uncontrolled HTN - NYHA class III - minimally fluid overload but stable symptoms - weighing daily; reminded to call for overnight weight gain of > 2 pounds or a weekly weight gain of > 5 pounds - weight up 4 pounds from last visit here 5 weeks ago - Echo 03/18/23: EF >55% with moderate LVH, mild LAE, mild MR/ TR - Echo 07/01/23: EF 50-55% with moderate LVH, Grade II DD, small pericardial effusion, mild MR - continue metolazone 2.5mg  2x/ weekly - continue metoprolol tartrate 75mg  BID - continue furosemide 60mg  daily - continue potassium daily - saw cardiology Fransico Michael) 11/24 - due to renal function, unable to use SGLT2, MRA, ARB/ ARNI - encouraged to start wearing compression socks daily with removal at bedtime - BNP 06/30/23 was 250.6  2: HTN- - BP 149/69 - continue hydralazine 25mg  BID - continue isosorbide MN 90mg  daily - emphasized getting PCP appt rescheduled - BMP 07/22/23 showed sodium 135, potassium 4.1, creatinine 3.05 & GFR 18  3: DM- - A1c 07/01/23 was 6.9% - saw endocrinology (Coviello) 05/24 - continue glipizide 5mg  BID  4: CKD- - saw nephrology Eulah Pont) 11/24 - BMP 07/22/23 showed sodium 135, potassium 4.1, creatinine 3.05 & GFR 18 - says that she's starting the process of kidney transplant & has to watch a video on this - transplant center number provided as she's not sure she can find the video in her mychart; if she can't find it, she will contact them  5: Snoring- - she says that she still hasn't been able to get her sleep study results - discussed that if she's unable to the get the results, we may have to repeat the  sleep study - she will keep [place] updated; if we need to reorder this, will order [place] home sleep study  Return in 4 months, sooner if needed.

## 2024-02-14 NOTE — Telephone Encounter (Signed)
 Called to confirm/remind patient of their appointment at the [facility] on 02/15/24.   Appointment:   [x] Confirmed  [] Left mess   [] No answer/No voice mail  [] VM Full/unable to leave message  [] Phone not in service  Patient reminded to bring all medications and/or complete list.  Confirmed patient has transportation. Gave directions, instructed to utilize valet parking.

## 2024-02-15 ENCOUNTER — Encounter: Admitting: Family

## 2024-03-14 ENCOUNTER — Encounter: Payer: Self-pay | Admitting: Family

## 2024-03-28 ENCOUNTER — Other Ambulatory Visit: Payer: Self-pay

## 2024-03-28 ENCOUNTER — Emergency Department
Admission: EM | Admit: 2024-03-28 | Discharge: 2024-03-28 | Disposition: A | Discharge status: Home / Self Care | Payer: Self-pay | Attending: Emergency Medicine | Admitting: Emergency Medicine

## 2024-03-28 DIAGNOSIS — Z992 Dependence on renal dialysis: Secondary | ICD-10-CM | POA: Diagnosis not present

## 2024-03-28 DIAGNOSIS — I959 Hypotension, unspecified: Secondary | ICD-10-CM

## 2024-03-28 DIAGNOSIS — E876 Hypokalemia: Secondary | ICD-10-CM | POA: Diagnosis not present

## 2024-03-28 DIAGNOSIS — E1122 Type 2 diabetes mellitus with diabetic chronic kidney disease: Secondary | ICD-10-CM | POA: Insufficient documentation

## 2024-03-28 DIAGNOSIS — N186 End stage renal disease: Secondary | ICD-10-CM | POA: Insufficient documentation

## 2024-03-28 DIAGNOSIS — R42 Dizziness and giddiness: Secondary | ICD-10-CM | POA: Diagnosis present

## 2024-03-28 DIAGNOSIS — Y828 Other medical devices associated with adverse incidents: Secondary | ICD-10-CM | POA: Diagnosis not present

## 2024-03-28 DIAGNOSIS — I9589 Other hypotension: Secondary | ICD-10-CM | POA: Diagnosis not present

## 2024-03-28 DIAGNOSIS — T829XXA Unspecified complication of cardiac and vascular prosthetic device, implant and graft, initial encounter: Secondary | ICD-10-CM

## 2024-03-28 DIAGNOSIS — T8249XA Other complication of vascular dialysis catheter, initial encounter: Secondary | ICD-10-CM | POA: Diagnosis not present

## 2024-03-28 DIAGNOSIS — N3001 Acute cystitis with hematuria: Secondary | ICD-10-CM | POA: Insufficient documentation

## 2024-03-28 DIAGNOSIS — I509 Heart failure, unspecified: Secondary | ICD-10-CM | POA: Diagnosis not present

## 2024-03-28 DIAGNOSIS — I132 Hypertensive heart and chronic kidney disease with heart failure and with stage 5 chronic kidney disease, or end stage renal disease: Secondary | ICD-10-CM | POA: Diagnosis not present

## 2024-03-28 LAB — BASIC METABOLIC PANEL WITH GFR
Anion gap: 11 (ref 5–15)
BUN: 23 mg/dL — ABNORMAL HIGH (ref 6–20)
CO2: 27 mmol/L (ref 22–32)
Calcium: 7.8 mg/dL — ABNORMAL LOW (ref 8.9–10.3)
Chloride: 97 mmol/L — ABNORMAL LOW (ref 98–111)
Creatinine, Ser: 3.77 mg/dL — ABNORMAL HIGH (ref 0.44–1.00)
GFR, Estimated: 14 mL/min — ABNORMAL LOW (ref >60)
Glucose, Bld: 223 mg/dL — ABNORMAL HIGH (ref 70–99)
Potassium: 3.1 mmol/L — ABNORMAL LOW (ref 3.5–5.1)
Sodium: 135 mmol/L (ref 135–145)

## 2024-03-28 LAB — CBC WITH DIFFERENTIAL/PLATELET
Abs Immature Granulocytes: 0.05 10*3/uL (ref 0.00–0.07)
Basophils Absolute: 0.1 10*3/uL (ref 0.0–0.1)
Basophils Relative: 1 %
Eosinophils Absolute: 0.3 10*3/uL (ref 0.0–0.5)
Eosinophils Relative: 4 %
HCT: 28.3 % — ABNORMAL LOW (ref 36.0–46.0)
Hemoglobin: 8.6 g/dL — ABNORMAL LOW (ref 12.0–15.0)
Immature Granulocytes: 1 %
Lymphocytes Relative: 18 %
Lymphs Abs: 1.5 10*3/uL (ref 0.7–4.0)
MCH: 25.3 pg — ABNORMAL LOW (ref 26.0–34.0)
MCHC: 30.4 g/dL (ref 30.0–36.0)
MCV: 83.2 fL (ref 80.0–100.0)
Monocytes Absolute: 0.6 10*3/uL (ref 0.1–1.0)
Monocytes Relative: 7 %
Neutro Abs: 5.9 10*3/uL (ref 1.7–7.7)
Neutrophils Relative %: 69 %
Platelets: 228 10*3/uL (ref 150–400)
RBC: 3.4 MIL/uL — ABNORMAL LOW (ref 3.87–5.11)
RDW: 14.2 % (ref 11.5–15.5)
WBC: 8.4 10*3/uL (ref 4.0–10.5)
nRBC: 0 % (ref 0.0–0.2)

## 2024-03-28 LAB — URINALYSIS, COMPLETE (UACMP) WITH MICROSCOPIC
Bilirubin Urine: NEGATIVE
Glucose, UA: >=500 mg/dL — AB
Ketones, ur: NEGATIVE mg/dL
Nitrite: NEGATIVE
Protein, ur: >=300 mg/dL — AB
RBC / HPF: >50 RBC/hpf (ref 0–5)
Specific Gravity, Urine: 1.014 (ref 1.005–1.030)
Squamous Epithelial / HPF: 0 /HPF (ref 0–5)
WBC, UA: >50 WBC/hpf (ref 0–5)
pH: 5 (ref 5.0–8.0)

## 2024-03-28 LAB — TROPONIN I (HIGH SENSITIVITY)
Troponin I (High Sensitivity): 24 ng/L — ABNORMAL HIGH (ref <18)
Troponin I (High Sensitivity): 26 ng/L — ABNORMAL HIGH (ref <18)

## 2024-03-28 MED ORDER — CEPHALEXIN 250 MG PO CAPS: 250.0000 mg | ORAL_CAPSULE | Freq: Every day | ORAL | 0 refills | Status: AC

## 2024-03-28 MED ORDER — SODIUM CHLORIDE 0.9 % IV BOLUS
250.0000 mL | Freq: Once | INTRAVENOUS | Status: AC
Start: 2024-03-28 — End: 2024-03-28
  Administered 2024-03-28: 250 mL via INTRAVENOUS

## 2024-03-28 MED ORDER — SODIUM CHLORIDE 0.9 % IV SOLN
1.0000 g | Freq: Once | INTRAVENOUS | Status: AC
  Administered 2024-03-28: 1 g via INTRAVENOUS
  Filled 2024-03-28: qty 10

## 2024-03-28 NOTE — ED Notes (Signed)
 Pt given ice chips at this time

## 2024-03-28 NOTE — ED Triage Notes (Signed)
 Pt BIB EMS from dialysis for dizziness.  Provider @ dialysis asked for pt to be sent to ER for eval of symptoms that have been present for weeks.

## 2024-03-28 NOTE — Discharge Instructions (Addendum)
 Your evaluation in the emergency department was notable for evidence of urinary tract infection, and this has been treated with antibiotics.  Your testing is otherwise reassuring.  I do suspect that your blood pressure dropped due to dialysis, I request you discuss this further with your primary care provider and kidney doctor.  Please take your antibiotics as prescribed.  Return to the emergency department with any new or worsening symptoms.

## 2024-03-28 NOTE — ED Provider Notes (Signed)
 [facility] Provider Note    Event Date/Time   First MD Initiated Contact with Patient 03/28/24 1726     (approximate)   History   Dizziness  Pt BIB EMS from dialysis for dizziness.  Provider @ dialysis asked for pt to be sent to ER for eval of symptoms that have been present for weeks.   HPI Shannon Cox is a 53 y.o. female PMH hypertension, CHF, ESRD on dialysis, anemia of chronic disease hypertension, IDDM presents for evaluation of lightheadedness - Patient has been on dialysis for about 4 weeks, tells me that she frequently gets lightheaded during dialysis and that her blood pressure is labile.  Reportedly had a systolic as low as the 60s today and was feeling lightheaded and nauseous so an ambulance was called.  Notes that she is feeling somewhat sluggish since then but denies any focal complaints. - Felt well prior to dialysis - Has previously been prescribed antihypertensives but has not been taking them since she started dialysis  Per chart review, last admitted in October 2020 for for nephrotic syndrome, AKI on CKD, shortness of breath     Physical Exam   Triage Vital Signs: ED Triage Vitals [03/28/24 1617]  Encounter Vitals Group     BP      Girls Systolic BP Percentile      Girls Diastolic BP Percentile      Boys Systolic BP Percentile      Boys Diastolic BP Percentile      Pulse      Resp      Temp      Temp src      SpO2      Weight 262 lb (118.8 kg)     Height 5' 5 (1.651 m)     Head Circumference      Peak Flow      Pain Score      Pain Loc      Pain Education      Exclude from Growth Chart   BP (!) 143/66   Pulse 86   Temp 98.4 F (36.9 C) (Oral)   Resp 18   Ht 5' 5 (1.651 m)   Wt 118.8 kg   SpO2 98%   BMI 43.60 kg/m    Most recent vital signs: Vitals:   03/28/24 1925 03/28/24 2213  BP:  (!) 143/66  Pulse:  86  Resp:  18  Temp: 98.4 F (36.9 C)   SpO2:  98%     General: Awake, no distress.   CV:  Good peripheral perfusion. RRR, RP 2+.  Dialysis access to right chest. Resp:  Normal effort. CTAB Abd:  No distention. Nontender to deep palpation throughout Neuro:  Aox4, CN II-XII intact, FNF wnl, finger taps fast b/l, 5/5 strength in bilateral finger extension/grip, arm flexion/extension, chronic foot drop to bilateral lower extremities BUE AG 10+ sec no drift, BLE AG 5+ sec no drift. SILT.     ED Results / Procedures / Treatments   Labs (all labs ordered are listed, but only abnormal results are displayed) Labs Reviewed  BASIC METABOLIC PANEL WITH GFR - Abnormal; Notable for the following components:      Result Value   Potassium 3.1 (*)    Chloride 97 (*)    Glucose, Bld 223 (*)    BUN 23 (*)    Creatinine, Ser 3.77 (*)    Calcium  7.8 (*)    GFR, Estimated 14 (*)  All other components within normal limits  CBC WITH DIFFERENTIAL/PLATELET - Abnormal; Notable for the following components:   RBC 3.40 (*)    Hemoglobin 8.6 (*)    HCT 28.3 (*)    MCH 25.3 (*)    All other components within normal limits  URINALYSIS, COMPLETE (UACMP) WITH MICROSCOPIC - Abnormal; Notable for the following components:   Color, Urine YELLOW (*)    APPearance TURBID (*)    Glucose, UA >=500 (*)    Hgb urine dipstick MODERATE (*)    Protein, ur >=300 (*)    Leukocytes,Ua LARGE (*)    Bacteria, UA MANY (*)    All other components within normal limits  TROPONIN I (HIGH SENSITIVITY) - Abnormal; Notable for the following components:   Troponin I (High Sensitivity) 24 (*)    All other components within normal limits  TROPONIN I (HIGH SENSITIVITY) - Abnormal; Notable for the following components:   Troponin I (High Sensitivity) 26 (*)    All other components within normal limits     EKG  Ecg = sinus rhythm, rate 89, no ST elevation or depression, isolated T wave inversion in lead III (nonspecific), normal axis, normal intervals.  QT prolonged at  510.   RADIOLOGY N/a    PROCEDURES:  Critical Care performed: No  Procedures   MEDICATIONS ORDERED IN ED: Medications  sodium chloride  0.9 % bolus 250 mL (0 mLs Intravenous Stopped 03/28/24 1934)  cefTRIAXone  (ROCEPHIN ) 1 g in sodium chloride  0.9 % 100 mL IVPB (0 g Intravenous Stopped 03/28/24 2007)     IMPRESSION / MDM / ASSESSMENT AND PLAN / ED COURSE  I reviewed the triage vital signs and the nursing notes.                              DDX/MDM/AP: Differential diagnosis includes, but is not limited to, likely hypotension secondary to volume shifts during dialysis, doubt ACS, doubt underlying infection given history and exam.  Fortunately patient is normotensive and overall asymptomatic here beyond fatigue.  Plan: - Labs - Small bolus IV fluid, to 50 cc - EKG - cardiac monitor - Reassess  Patient's presentation is most consistent with acute presentation with potential threat to life or bodily function.  The patient is on the cardiac monitor to evaluate for evidence of arrhythmia and/or significant heart rate changes.  ED course below.  Unremarkable.  No further hypotension here.  Doing better after small bolus of IV fluid.  Evidence of underlying UTI, treated with ceftriaxone  and discharged on Keflex .  No evidence of acute pathology at this time, plan for close PMD and nephrology follow-up.  ED return precautions in place.  Patient agrees with plan.  Clinical Course as of 03/29/24 0047  Tue Mar 28, 2024  1818 CBC with no leukocytosis, anemia within baseline range [MM]  1832 BMP reviewed, overall unremarkable.  Mild hypokalemia, will defer repletion in the setting of ESRD on dialysis [MM]  1832 Urinalysis consistent with infection, not grossly contaminated  Suspect UTI may be contributing to patient's generalized fatigue [MM]  1901 Repeat trop stable from prior [MM]  2132 Repeat Trop stable [MM]    Clinical Course User Index [MM] Clarine Ozell LABOR, MD     FINAL  CLINICAL IMPRESSION(S) / ED DIAGNOSES   Final diagnoses:  Transient hypotension  Acute cystitis with hematuria  Dialysis complication, initial encounter     Rx / DC Orders   ED Discharge  Orders          Ordered    cephALEXin  (KEFLEX ) 250 MG capsule  Daily        03/28/24 2144             Note:  This document was prepared using Dragon voice recognition software and may include unintentional dictation errors.   Clarine Ozell LABOR, MD 03/29/24 [phone]

## 2024-03-31 ENCOUNTER — Telehealth: Payer: Self-pay | Admitting: Family

## 2024-03-31 NOTE — Telephone Encounter (Signed)
 Called to confirm/remind patient of their appointment at the [facility] on 04/03/24.   Appointment:   [x] Confirmed  [] Left mess   [] No answer/No voice mail  [] VM Full/unable to leave message  [] Phone not in service  Patient reminded to bring all medications and/or complete list.  Confirmed patient has transportation. Gave directions, instructed to utilize valet parking.

## 2024-04-02 NOTE — Progress Notes (Deleted)
 [facility] Note   PCP: Pcp, No Cardiologist: Franchester, Cadence, PA (last seen 11/24)  HPI:  Ms Potenza is a 53 y/o female with a history of anemia, CKD stage 4-5, IDDM, obesity, OSA, HTN, recurrent pancreatitis, gastritis and chronic heart failure.   Admitted 03/17/23 due to nausea a week ago, and had one episode of diarrhea 3 days ago, then started to have epigastric pain since that time. Her pain was stabbing in nature, located on epigastric and LUQ area, radiating to her mid back, associated with nausea, diarrhea, not with vomiting, moderately relieved with home oral opoid pain medications, no aggravating factors, constant. Reported some chills, but denied fever, LOC, fall, headache, dizziness, chest pain or pressure, SOB, cough, sputum production, hemoptysis, hematemesis, hematochezia, melena, dysuria, hematuria, or focal weakness. BP elevated. Urine + UTI. EKG was not remarkable for acute ischemia, CXR showed no active lesions except cardiomegaly, CT AP showed small pericardial effusion, small retroperitoneal fluids, normal pancreas, diverticulosis without inflammation. IVF given. GI symptoms improved on own. Empiric trial of PPI for now given patient-reported delay in arranging endoscopy. Significant hypoalbuminemia of 1.9 and 3+ protein on UA. TTE this admission with normal EF. Small pericardial effusion without hemodynamic significance.     Admitted 06/30/23 due to shortness of breath that acutely worsened on the evening of presentation while lying flat, improved somewhat by sitting upright. She had an upper endoscopy the day prior at [facility] that showed gastritis, biopsies pending. She reports that her lower extremity edema has been worsening for the past week. Troponin 27 and BNP 250.  EKG showed NSR at 95 with no acute ST-T wave changes. Chest x-ray shows cardiomegaly with no active cardiopulmonary disease. IV diuresed. Urinalysis positive for UTI, urine protein> 3000, nephrotic  range. Nephrology consulted, recommended renal biopsy, IR consulted, s/p left renal biopsy done 10/1. Antibiotics given for UTI.   Echo 03/18/23: EF >55% with moderate LVH, mild LAE, mild MR/ TR Echo 07/01/23: EF 50-55% with moderate LVH, Grade II DD, small pericardial effusion, mild MR  She presents today for a HF follow-up visit with a chief complaint of moderate fatigue with minimal exertion. Chronic in nature. Has associated minimal shortness of breath, intermittent chest pain/ palpitations, dizziness, nausea, pedal edema and chronic difficulty sleeping along with this. Denies cough, abdominal distention or weight gain.   Is starting the process of a possible kidney transplant and has to watch a video on this. Had a sleep study done ~ 08/24 with her PCP office but says that that office is closed and she has been unable to get her results. She is unsure if the office is closed permanently or not. She says that she has woken herself up with apneic episodes and wakes up feeling just as tired as when she went to sleep.    ROS: All systems negative except as listed in HPI, PMH and Problem List.  SH:  Social History   Socioeconomic History   Marital status: Divorced    Spouse name: Not on file   Number of children: Not on file   Years of education: Not on file   Highest education level: Not on file  Occupational History   Not on file  Tobacco Use   Smoking status: Never   Smokeless tobacco: Never  Substance and Sexual Activity   Alcohol use: Not Currently   Drug use: Never   Sexual activity: Not on file  Other Topics Concern   Not on file  Social  History Narrative   Not on file   Social Drivers of Health   Financial Resource Strain: Low Risk  (03/18/2021)   Received from [facility]   Overall Financial Resource Strain (CARDIA)    Difficulty of Paying Living Expenses: Not very hard  Food Insecurity: No Food Insecurity (07/07/2023)   Hunger Vital Sign    Worried  About Running Out of Food in the Last Year: Never true    Ran Out of Food in the Last Year: Never true  Transportation Needs: No Transportation Needs (07/07/2023)   PRAPARE - Administrator, Civil Service (Medical): No    Lack of Transportation (Non-Medical): No  Physical Activity: Inactive (03/18/2021)   Received from [facility]   Exercise Vital Sign    On average, how many days per week do you engage in moderate to strenuous exercise (like a brisk walk)?: 0 days    On average, how many minutes do you engage in exercise at this level?: 0 min  Stress: Stress Concern Present (03/18/2021)   Received from [facility]  of Occupational Health - Occupational Stress Questionnaire    Feeling of Stress : Rather much  Social Connections: Unknown (03/18/2021)   Received from [facility]   Social Connection and Isolation Panel    In a typical week, how many times do you talk on the phone with family, friends, or neighbors?: More than three times a week    How often do you get together with friends or relatives?: More than three times a week    How often do you attend church or religious services?: Never    Do you belong to any clubs or organizations such as church groups, unions, fraternal or athletic groups, or school groups?: No    How often do you attend meetings of the clubs or organizations you belong to?: Never    Are you married, widowed, divorced, separated, never married, or living with a partner?: Patient declined  Intimate Partner Violence: Not At Risk (07/07/2023)   Humiliation, Afraid, Rape, and Kick questionnaire    Fear of Current or Ex-Partner: No    Emotionally Abused: No    Physically Abused: No    Sexually Abused: No    FH: No family history on file.  Past Medical History:  Diagnosis Date   Hypertension     Current Outpatient Medications  Medication Sig Dispense Refill   cephALEXin  (KEFLEX )  250 MG capsule Take 1 capsule (250 mg total) by mouth daily for 6 days. On days in which you have dialysis, take after dialysis. 6 capsule 0   cetirizine  (ZYRTEC ) 10 MG tablet Take 1 tablet (10 mg total) by mouth daily. 30 tablet 11   Cholecalciferol 1.25 MG (50000 UT) capsule Take 50,000 Units by mouth once a week.     folic acid  (FOLVITE ) 1 MG tablet Take 1 tablet (1 mg total) by mouth daily. 30 tablet 2   furosemide  (LASIX ) 20 MG tablet Take 3 tablets (60 mg total) by mouth daily. 270 tablet 3   glipiZIDE (GLUCOTROL XL) 5 MG 24 hr tablet Take 1 tablet by mouth 2 (two) times daily.     hydrALAZINE  (APRESOLINE ) 25 MG tablet Take 1 tablet by mouth 2 (two) times daily with a meal.     isosorbide  mononitrate (IMDUR ) 120 MG 24 hr tablet Take 1 tablet (120 mg total) by mouth daily. 90 tablet 3   meclizine  (  ANTIVERT ) 25 MG tablet Take 1 tablet (25 mg total) by mouth 3 (three) times daily as needed for dizziness. 30 tablet 0   metolazone  (ZAROXOLYN ) 2.5 MG tablet Take 1 tablet (2.5 mg total) by mouth 2 (two) times a week. 8 tablet 3   Metoprolol  Tartrate 75 MG TABS Take 1 tablet (75 mg total) by mouth 2 (two) times daily. 30 tablet 2   ondansetron  (ZOFRAN ) 4 MG tablet Take 1 tablet (4 mg total) by mouth every 8 (eight) hours as needed for nausea or vomiting. 20 tablet 1   pantoprazole  (PROTONIX ) 20 MG tablet Take 1 tablet (20 mg total) by mouth daily. 30 tablet 1   potassium chloride  SA (KLOR-CON  M) 20 MEQ tablet Take 1 tablet (20 mEq total) by mouth daily. 90 tablet 3   rosuvastatin  (CRESTOR ) 20 MG tablet Take 1 tablet (20 mg total) by mouth daily. 30 tablet 2   TRESIBA FLEXTOUCH 200 UNIT/ML FlexTouch Pen Inject 25 Units into the skin daily.     No current facility-administered medications for this visit.   There were no vitals filed for this visit.  Wt Readings from Last 3 Encounters:  03/28/24 262 lb (118.8 kg)  11/09/23 266 lb 6.4 oz (120.8 kg)  08/31/23 252 lb (114.3 kg)   Lab Results   Component Value Date   CREATININE 3.77 (H) 03/28/2024   CREATININE 3.05 (H) 07/22/2023   CREATININE 3.79 (H) 07/08/2023    PHYSICAL EXAM:  General:  Well appearing. No resp difficulty HEENT: normal Neck: supple. JVP flat. No lymphadenopathy or thryomegaly appreciated. Cor: PMI normal. Regular rate & rhythm. No rubs, gallops or murmurs. Lungs: clear Abdomen: soft, nontender, nondistended. No hepatosplenomegaly. No bruits or masses.  Extremities: no cyanosis, clubbing, rash, 1+ pitting edema bilaterally to mid-shin Neuro: alert & orientedx3, cranial nerves grossly intact. Moves all 4 extremities w/o difficulty. Affect pleasant.   ECG: not done   ASSESSMENT & PLAN:  1: NICM with preserved ejection fraction- - suspect due to uncontrolled HTN - NYHA class III - minimally fluid overload but stable symptoms - weighing daily; reminded to call for overnight weight gain of > 2 pounds or a weekly weight gain of > 5 pounds - weight up 4 pounds from last visit here 5 weeks ago - Echo 03/18/23: EF >55% with moderate LVH, mild LAE, mild MR/ TR - Echo 07/01/23: EF 50-55% with moderate LVH, Grade II DD, small pericardial effusion, mild MR - continue metolazone  2.5mg  2x/ weekly - continue metoprolol  tartrate 75mg  BID - continue furosemide  60mg  daily - continue potassium 20meq daily - saw cardiology Dene) 11/24 - due to renal function, unable to use SGLT2, MRA, ARB/ ARNI - encouraged to start wearing compression socks daily with removal at bedtime - BNP 06/30/23 was 250.6  2: HTN- - BP 149/69 - continue hydralazine  25mg  BID - continue isosorbide  MN 90mg  daily - emphasized getting PCP appt rescheduled - BMP 07/22/23 showed sodium 135, potassium 4.1, creatinine 3.05 & GFR 18  3: DM- - A1c 07/01/23 was 6.9% - saw endocrinology (Coviello) 05/24 - continue glipizide 5mg  BID  4: CKD- - saw nephrology Alphonse) 11/24 - BMP 07/22/23 showed sodium 135, potassium 4.1, creatinine 3.05 & GFR  18 - says that she's starting the process of kidney transplant & has to watch a video on this - transplant center number provided as she's not sure she can find the video in her mychart; if she can't find it, she will contact them  5: Snoring- -  she says that she still hasn't been able to get her sleep study results - discussed that if she's unable to the get the results, we may have to repeat the sleep study - she will keep us  updated; if we need to reorder this, will order [facility] home sleep study  Return in 4 months, sooner if needed.

## 2024-04-03 ENCOUNTER — Telehealth: Payer: Self-pay | Admitting: Family

## 2024-04-03 ENCOUNTER — Encounter: Admitting: Family

## 2024-04-03 NOTE — Telephone Encounter (Signed)
 Patient did not show for her [facility] appointment on 04/03/24.

## 2024-10-18 ENCOUNTER — Encounter: Payer: Self-pay | Admitting: Nephrology
